# Patient Record
Sex: Male | Born: 1955 | Race: White | Hispanic: No | Marital: Married | Smoking: Current every day smoker
Health system: Southern US, Community
[De-identification: ages and names within clinical notes are randomized; demographics above are authoritative.]

## PROBLEM LIST (undated history)

## (undated) DIAGNOSIS — I10 Essential (primary) hypertension: Secondary | ICD-10-CM

## (undated) DIAGNOSIS — I82409 Acute embolism and thrombosis of unspecified deep veins of unspecified lower extremity: Secondary | ICD-10-CM

## (undated) DIAGNOSIS — G44009 Cluster headache syndrome, unspecified, not intractable: Secondary | ICD-10-CM

## (undated) DIAGNOSIS — I2510 Atherosclerotic heart disease of native coronary artery without angina pectoris: Secondary | ICD-10-CM

## (undated) DIAGNOSIS — J449 Chronic obstructive pulmonary disease, unspecified: Secondary | ICD-10-CM

## (undated) DIAGNOSIS — R011 Cardiac murmur, unspecified: Secondary | ICD-10-CM

## (undated) HISTORY — PX: HEMORRHOID SURGERY: SHX153

## (undated) HISTORY — DX: Atherosclerotic heart disease of native coronary artery without angina pectoris: I25.10

## (undated) HISTORY — DX: Cardiac murmur, unspecified: R01.1

## (undated) HISTORY — PX: WISDOM TOOTH EXTRACTION: SHX21

## (undated) HISTORY — DX: Essential (primary) hypertension: I10

## (undated) HISTORY — DX: Acute embolism and thrombosis of unspecified deep veins of unspecified lower extremity: I82.409

## (undated) HISTORY — DX: Chronic obstructive pulmonary disease, unspecified: J44.9

## (undated) HISTORY — DX: Cluster headache syndrome, unspecified, not intractable: G44.009

---

## 2005-02-05 ENCOUNTER — Encounter: Admission: RE | Admit: 2005-02-05 | Discharge: 2005-02-05 | Payer: Self-pay | Admitting: Internal Medicine

## 2008-12-21 ENCOUNTER — Encounter: Payer: Self-pay | Admitting: Gastroenterology

## 2011-07-24 ENCOUNTER — Encounter: Payer: Self-pay | Admitting: Gastroenterology

## 2011-08-28 ENCOUNTER — Encounter: Payer: Self-pay | Admitting: Gastroenterology

## 2013-02-07 ENCOUNTER — Ambulatory Visit (INDEPENDENT_AMBULATORY_CARE_PROVIDER_SITE_OTHER): Payer: BC Managed Care – PPO | Admitting: Physician Assistant

## 2013-02-07 VITALS — BP 100/60 | HR 58 | Temp 98.4°F | Resp 16 | Ht 71.0 in | Wt 206.0 lb

## 2013-02-07 DIAGNOSIS — J329 Chronic sinusitis, unspecified: Secondary | ICD-10-CM

## 2013-02-07 DIAGNOSIS — Z720 Tobacco use: Secondary | ICD-10-CM | POA: Insufficient documentation

## 2013-02-07 MED ORDER — IPRATROPIUM BROMIDE 0.03 % NA SOLN: 2.0000 | Freq: Two times a day (BID) | NASAL | Status: DC

## 2013-02-07 MED ORDER — AMOXICILLIN-POT CLAVULANATE 875-125 MG PO TABS: 1.0000 | ORAL_TABLET | Freq: Two times a day (BID) | ORAL | Status: DC

## 2013-02-07 MED ORDER — GUAIFENESIN ER 1200 MG PO TB12: 1.0000 | ORAL_TABLET | Freq: Two times a day (BID) | ORAL | Status: DC | PRN

## 2013-02-07 NOTE — Progress Notes (Signed)
I have examined this patient along with the student and agree.  Encouraged smoking cessation.  Flu vaccine given elsewhere.

## 2013-02-07 NOTE — Patient Instructions (Signed)
Drink plenty of fluids and get lots of rest! Take full course of antibiotic, Augmentin. Use Mucinex and Atrovent nasal spray to help loosen up mucus and decrease congestion.

## 2013-02-07 NOTE — Progress Notes (Signed)
  Subjective:    Patient ID: Duffy Dantonio, male    DOB: 1955/11/02, 57 y.o.   MRN: [id number]  HPI  Mr Ferd Horrigan is a 57 year old with a 1 PPD smoking history and cluster headaches who complains today of sinus pain and congestion, nasal congestion, and cough for the past 3 weeks. His symptoms were resolving last week so he went out and did heavy work for two days. Over the weekend, he was extremely fatigued and his sinus pain and congestion has worsened. He does not believe he has had fever, chills, or night sweats. His cough is productive at times, yellow-green sputum with no streaks of blood. He has nasal drainage down his throat and sinus congestion. He developed left upper tooth pain this weekend that is sensitive to deep breaths. No SOB, chest pain/tightness, headaches, or ear congestion/pain. No nausea,vomiting, diarrhea, or constipation.   He has tried various OTC medicines for his symptoms but his cold has lingered. He has had sinus infections in the past and they tend to linger. He had a CT of his sinuses a few years ago which were 'clear.'    Review of Systems As above    Objective:   Physical Exam  Constitutional: He is oriented to person, place, and time. He appears well-developed and well-nourished.  HENT:  Head: Normocephalic and atraumatic.  Right Ear: Tympanic membrane, external ear and ear canal normal.  Left Ear: Tympanic membrane, external ear and ear canal normal.  Nose: Rhinorrhea present. No mucosal edema or sinus tenderness. Right sinus exhibits no maxillary sinus tenderness and no frontal sinus tenderness. Left sinus exhibits maxillary sinus tenderness. Left sinus exhibits no frontal sinus tenderness.  Mouth/Throat: Uvula is midline. Abnormal dentition. Dental caries present. Posterior oropharyngeal erythema present. No oropharyngeal exudate, posterior oropharyngeal edema or tonsillar abscesses.  Eyes: Conjunctivae are normal. Right eye exhibits no discharge. Left eye  exhibits no discharge.  Cardiovascular: Normal rate, regular rhythm and normal heart sounds.   Pulmonary/Chest: Effort normal and breath sounds normal.  Lymphadenopathy:    He has no cervical adenopathy.  Neurological: He is alert and oriented to person, place, and time.  Skin: He is not diaphoretic.  Psychiatric: He has a normal mood and affect.   BP 100/60  Pulse 58  Temp(Src) 98.4 F (36.9 C) (Oral)  Resp 16  Ht 5\' 11"  (1.803 m)  Wt 206 lb (93.441 kg)  BMI 28.74 kg/m2  SpO2 98%     Assessment & Plan:   Sinusitis - Plan: amoxicillin-clavulanate (AUGMENTIN) 875-125 MG per tablet, Guaifenesin (MUCINEX MAXIMUM STRENGTH) 1200 MG TB12, ipratropium (ATROVENT) 0.03 % nasal spray  Patient Instructions  Drink plenty of fluids and get lots of rest! Take full course of antibiotic, Augmentin. Use Mucinex and Atrovent nasal spray to help loosen up mucus and decrease congestion.

## 2014-11-03 ENCOUNTER — Ambulatory Visit
Admission: RE | Admit: 2014-11-03 | Discharge: 2014-11-03 | Disposition: A | Discharge status: Home / Self Care | Payer: BLUE CROSS/BLUE SHIELD | Source: Ambulatory Visit | Attending: Internal Medicine | Admitting: Internal Medicine

## 2014-11-03 ENCOUNTER — Other Ambulatory Visit: Payer: Self-pay | Admitting: Internal Medicine

## 2014-11-03 DIAGNOSIS — R52 Pain, unspecified: Secondary | ICD-10-CM

## 2014-12-26 ENCOUNTER — Ambulatory Visit (INDEPENDENT_AMBULATORY_CARE_PROVIDER_SITE_OTHER): Payer: BLUE CROSS/BLUE SHIELD | Admitting: Sports Medicine

## 2014-12-26 ENCOUNTER — Encounter: Payer: Self-pay | Admitting: Sports Medicine

## 2014-12-26 VITALS — BP 125/86 | Ht 72.0 in | Wt 200.0 lb

## 2014-12-26 DIAGNOSIS — M7742 Metatarsalgia, left foot: Secondary | ICD-10-CM | POA: Diagnosis not present

## 2014-12-26 DIAGNOSIS — M7741 Metatarsalgia, right foot: Secondary | ICD-10-CM | POA: Diagnosis not present

## 2014-12-26 NOTE — Progress Notes (Signed)
   Subjective:    Patient ID: Brad Massey, male    DOB: [birth year], 59 y.o.   MRN: [id number]  HPI chief complaint: Left foot pain  Very pleasant 59 year old male comes in today at the request of Dr. Alfonso Ramus for evaluation and consideration of custom orthotics. He was seen by Dr. Alfonso Ramus on August 23 and diagnosed with metatarsalgia. Patient was given a prescription for [employer] to get custom orthotics but the patient preferred to come here. That is his primary reason for today's visit. He localizes pain along the plantar aspect of both feet, left greater than right primarily at the third metatarsal heads. He denies trauma. No swelling. No numbness or tingling.  Medical history reviewed Medications reviewed Allergies reviewed    Review of Systems     Objective:   Physical Exam Well-developed, well-nourished. No acute distress. Awake alert and oriented 3. Vital signs reviewed  Examination of his feet in the standing position shows a well-preserved arch.-She has a little bit of pes cavus on the left. No soft tissue swelling. He is tender to palpation along the third metatarsal heads bilaterally, left greater than right. He also has some callus buildup along the third fourth and fifth metatarsal heads. No erythema. Neurovascularly intact distally. Walking without a limp.       Assessment & Plan:  Bilateral foot pain, left greater than right secondary to metatarsalgia  Custom orthotics were created today. Metatarsal pads were added. Patient found these comfortable prior to leaving the office. A total of 30 minutes was spent with the patient with greater than 50% of the time spent in face-to-face consultation discussing orthotic construction, instruction, and fitting. Patient found the orthotics comfortable prior to leaving. Patient will follow-up as needed.  Patient was fitted for a : standard, cushioned, semi-rigid orthotic. The orthotic was heated and afterward the patient stood on the  orthotic blank positioned on the orthotic stand. The patient was positioned in subtalar neutral position and 10 degrees of ankle dorsiflexion in a weight bearing stance. After completion of molding, a stable base was applied to the orthotic blank. The blank was ground to a stable position for weight bearing. Size: 11 Base: Blue EVA Posting: none Additional orthotic padding: b/l metatarsal pads

## 2015-03-15 ENCOUNTER — Other Ambulatory Visit: Payer: Self-pay | Admitting: Internal Medicine

## 2015-03-15 DIAGNOSIS — G4489 Other headache syndrome: Secondary | ICD-10-CM

## 2015-03-21 ENCOUNTER — Ambulatory Visit
Admission: RE | Admit: 2015-03-21 | Discharge: 2015-03-21 | Disposition: A | Discharge status: Home / Self Care | Payer: BLUE CROSS/BLUE SHIELD | Source: Ambulatory Visit | Attending: Internal Medicine | Admitting: Internal Medicine

## 2015-03-21 DIAGNOSIS — G4489 Other headache syndrome: Secondary | ICD-10-CM

## 2015-04-10 ENCOUNTER — Ambulatory Visit
Admission: RE | Admit: 2015-04-10 | Discharge: 2015-04-10 | Disposition: A | Discharge status: Home / Self Care | Payer: BLUE CROSS/BLUE SHIELD | Source: Ambulatory Visit | Attending: Internal Medicine | Admitting: Internal Medicine

## 2015-04-10 ENCOUNTER — Other Ambulatory Visit: Payer: Self-pay | Admitting: Internal Medicine

## 2015-04-10 DIAGNOSIS — M7989 Other specified soft tissue disorders: Secondary | ICD-10-CM

## 2015-04-10 DIAGNOSIS — M79604 Pain in right leg: Secondary | ICD-10-CM

## 2016-07-21 IMAGING — US US EXTREM LOW VENOUS*R*
1 series · 13 of 24 positions shown · non-contrast
Comparison: None.

CLINICAL DATA: Right calf pain and edema since [REDACTED]. History
smoking. Evaluate for DVT.



[Series 1: us extrem low venous*right* · 13 of 48 slices shown]
[im 1/48]
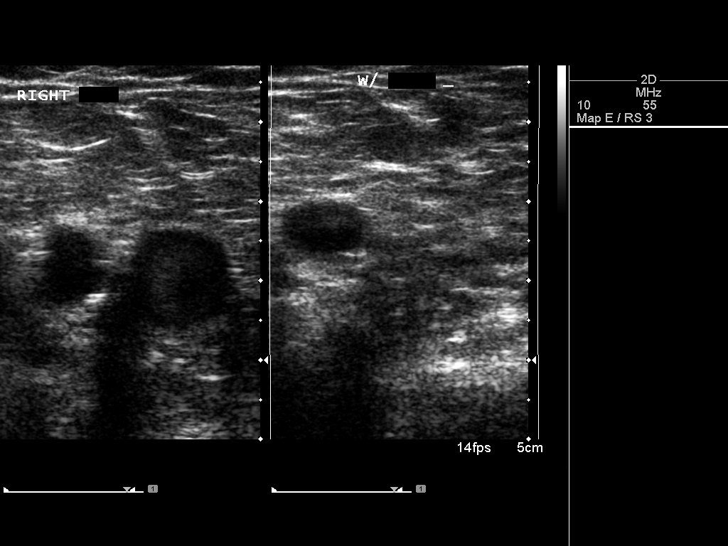
[im 5/48]
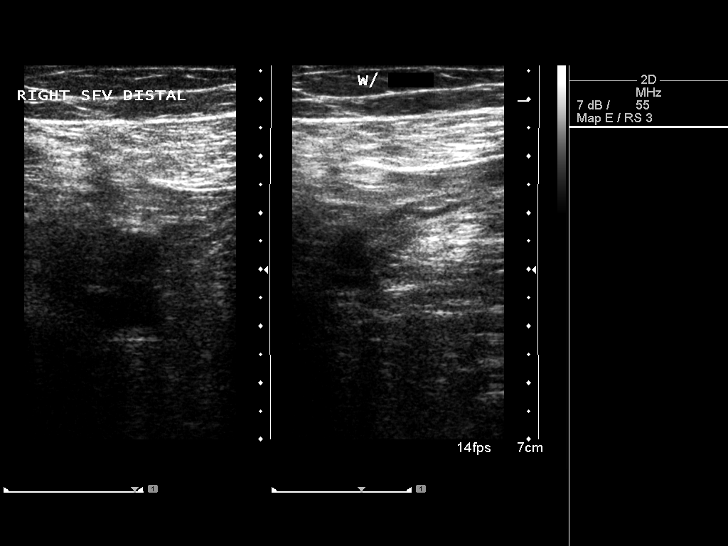
[im 9/48]
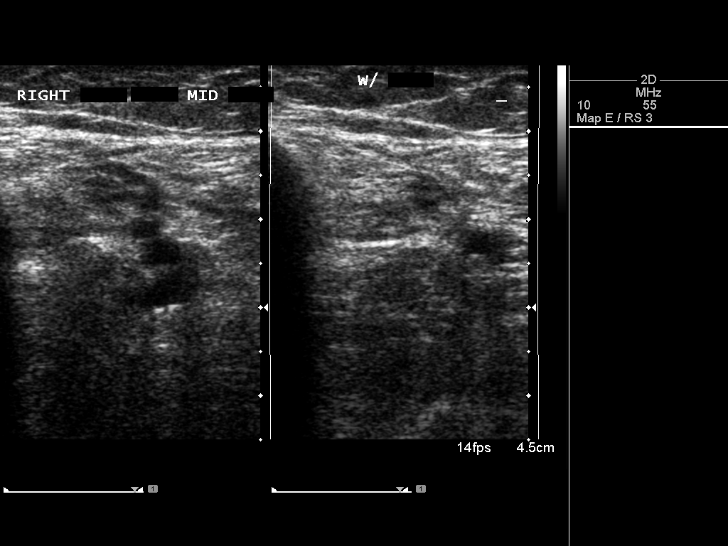
[im 13/48]
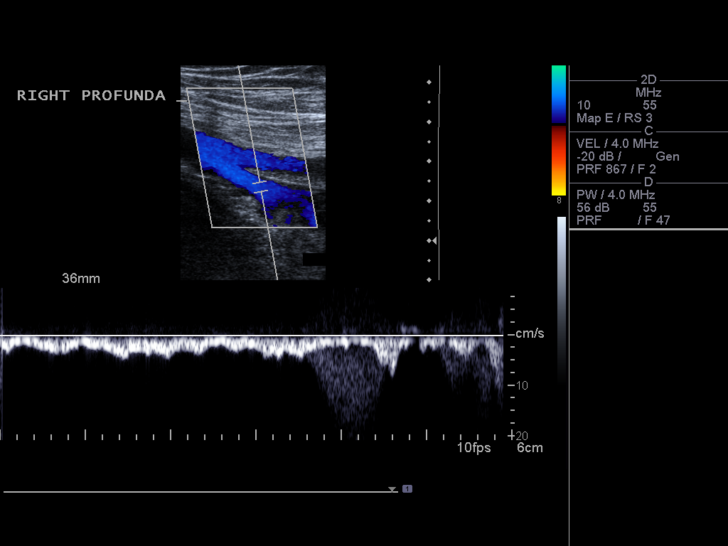
[im 17/48]
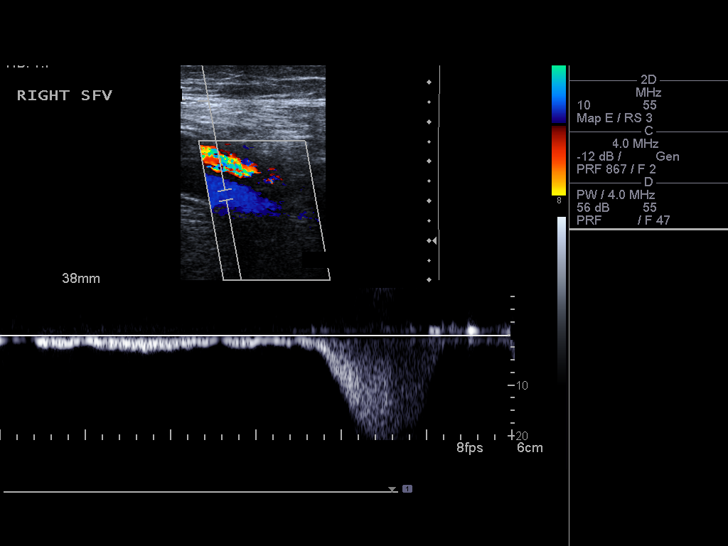
[im 21/48]
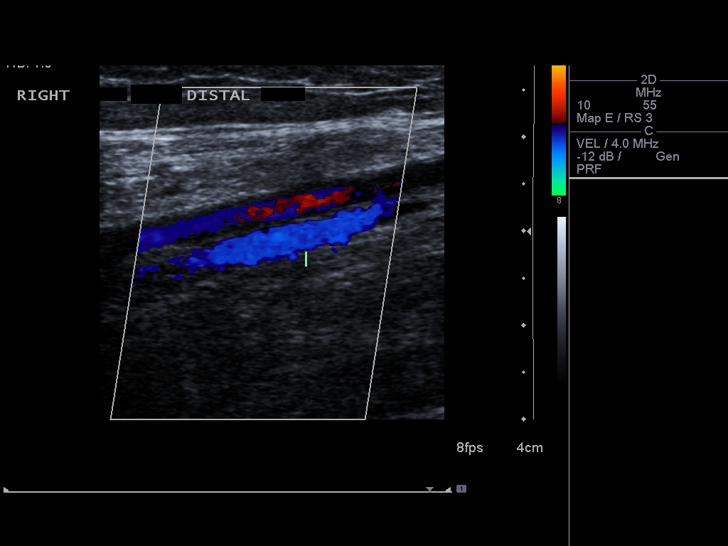
[im 25/48]
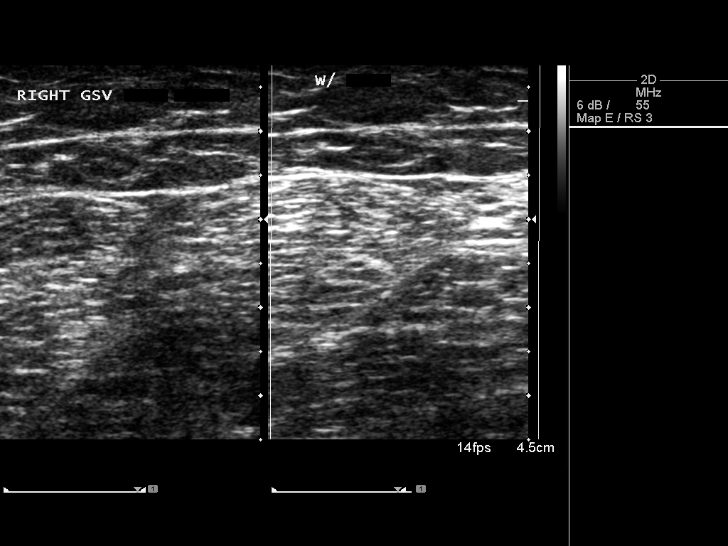
[im 27/48]
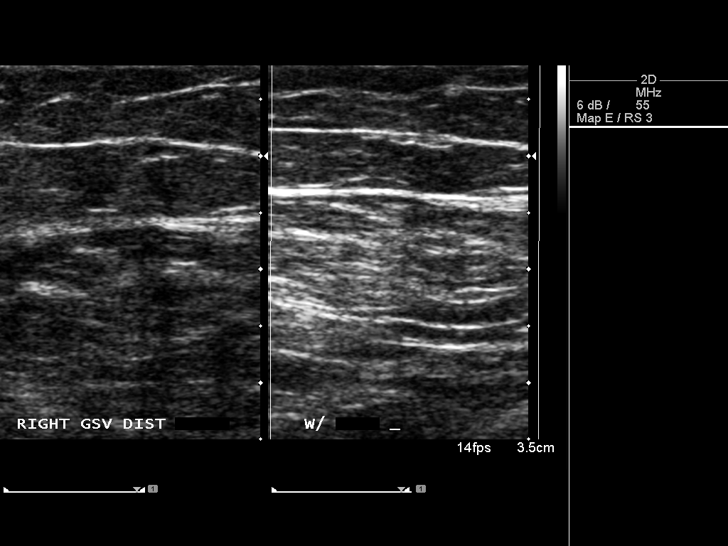
[im 31/48]
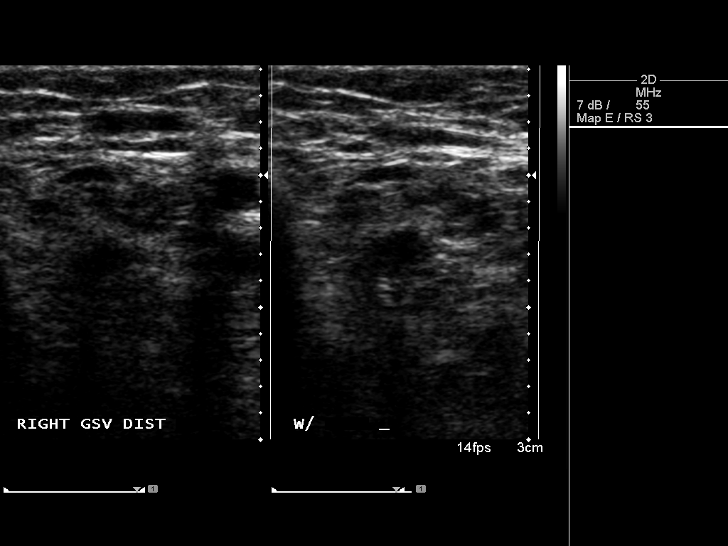
[im 35/48]
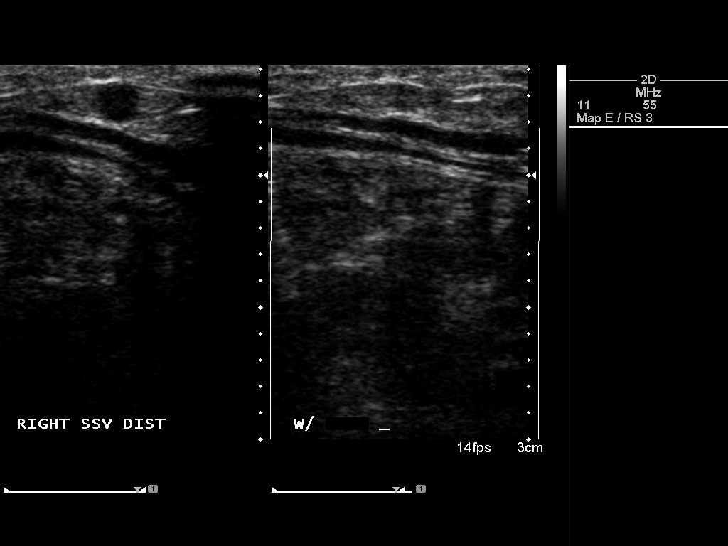
[im 39/48]
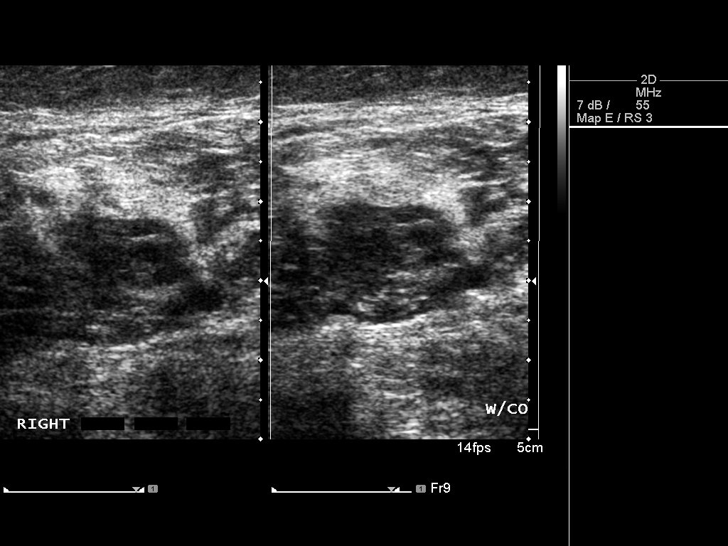
[im 43/48]
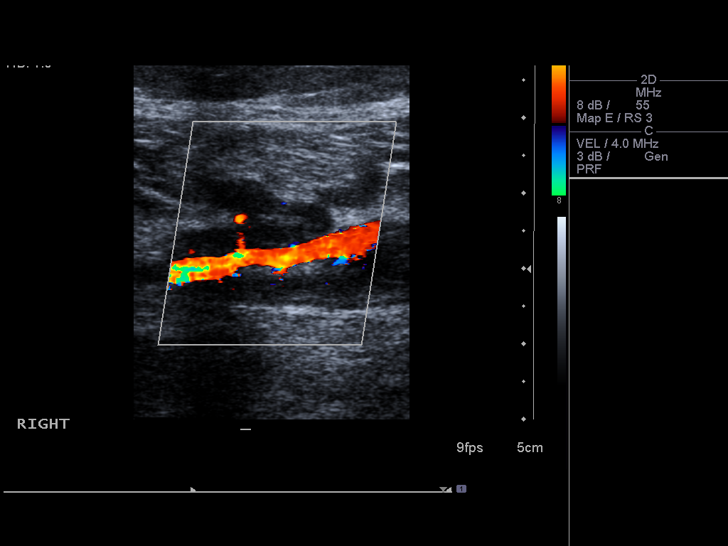
[im 48/48]
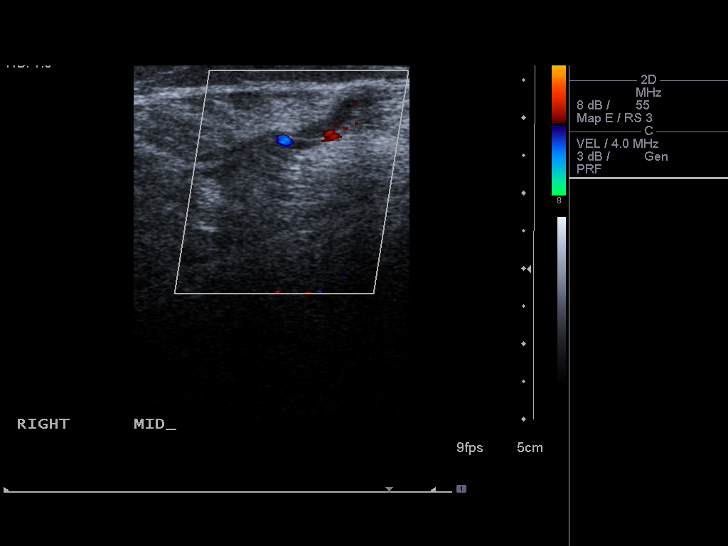

[13 of 24 positions shown; findings below may reference images not displayed]

FINDINGS: Contralateral Common Femoral Vein: Respiratory phasicity is normal
and symmetric with the symptomatic side. No evidence of thrombus.
Normal compressibility.

Common Femoral Vein: No evidence of thrombus. Normal
compressibility, respiratory phasicity and response to augmentation.

Saphenofemoral Junction: No evidence of thrombus. Normal
compressibility and flow on color Doppler imaging.

Profunda Femoral Vein: No evidence of thrombus. Normal
compressibility and flow on color Doppler imaging.

Femoral Vein: No evidence of thrombus. Normal compressibility,
respiratory phasicity and response to augmentation.

Popliteal Vein: No evidence of thrombus. Normal compressibility,
respiratory phasicity and response to augmentation.

Calf Veins: No evidence of thrombus. Normal compressibility and flow
on color Doppler imaging.

Superficial Great Saphenous Vein: No evidence of thrombus. Normal
compressibility and flow on color Doppler imaging.

Venous Reflux:  None.

Other Findings: There is hypoechoic expansile occlusive superficial
thrombophlebitis involving the lesser saphenous vein extending to
involve paired posterior tibial perforator veins within the upper
and mid calf (representative images 38 through 47). This extends to
abut the confluence with the right popliteal vein (image 38) without
definitive extension to deep venous system of the right lower
extremity.
IMPRESSION: 1. No evidence of DVT within right lower extremity.
2. Examination is positive for is superficial thrombophlebitis
involving the lesser saphenous vein extending to involve the paired
posterior tibial perforator veins within the upper and mid calf.

## 2017-06-15 ENCOUNTER — Other Ambulatory Visit: Payer: Self-pay | Admitting: Internal Medicine

## 2017-06-15 ENCOUNTER — Ambulatory Visit
Admission: RE | Admit: 2017-06-15 | Discharge: 2017-06-15 | Disposition: A | Discharge status: Home / Self Care | Payer: Managed Care, Other (non HMO) | Source: Ambulatory Visit | Attending: Internal Medicine | Admitting: Internal Medicine

## 2017-06-15 DIAGNOSIS — R1031 Right lower quadrant pain: Secondary | ICD-10-CM

## 2018-01-29 ENCOUNTER — Other Ambulatory Visit: Payer: Self-pay | Admitting: Internal Medicine

## 2018-02-02 ENCOUNTER — Other Ambulatory Visit: Payer: Self-pay | Admitting: Internal Medicine

## 2018-02-02 DIAGNOSIS — Z122 Encounter for screening for malignant neoplasm of respiratory organs: Secondary | ICD-10-CM

## 2018-06-25 ENCOUNTER — Other Ambulatory Visit: Payer: Self-pay | Admitting: Podiatry

## 2018-06-25 ENCOUNTER — Ambulatory Visit: Payer: Managed Care, Other (non HMO) | Admitting: Podiatry

## 2018-06-25 ENCOUNTER — Ambulatory Visit (INDEPENDENT_AMBULATORY_CARE_PROVIDER_SITE_OTHER): Payer: Managed Care, Other (non HMO)

## 2018-06-25 DIAGNOSIS — M7741 Metatarsalgia, right foot: Secondary | ICD-10-CM

## 2018-06-25 DIAGNOSIS — D3610 Benign neoplasm of peripheral nerves and autonomic nervous system, unspecified: Secondary | ICD-10-CM

## 2018-06-25 DIAGNOSIS — M7752 Other enthesopathy of left foot: Secondary | ICD-10-CM | POA: Diagnosis not present

## 2018-06-25 DIAGNOSIS — M7751 Other enthesopathy of right foot: Secondary | ICD-10-CM

## 2018-06-25 DIAGNOSIS — M7742 Metatarsalgia, left foot: Secondary | ICD-10-CM | POA: Diagnosis not present

## 2018-06-25 DIAGNOSIS — M779 Enthesopathy, unspecified: Secondary | ICD-10-CM

## 2018-06-25 MED ORDER — MELOXICAM 15 MG PO TABS: 15.0000 mg | ORAL_TABLET | Freq: Every day | ORAL | 0 refills | Status: AC

## 2018-06-25 MED ORDER — TRIAMCINOLONE ACETONIDE 10 MG/ML IJ SUSP: 10.0000 mg | Freq: Once | INTRAMUSCULAR | Status: DC

## 2018-06-26 NOTE — Progress Notes (Signed)
Subjective:   Patient ID: Brad Massey, male   DOB: 63 y.o.   MRN: [id number]   HPI 63 year old male presents the office today for concerns of pain to both of his feet.  He states that he previously did see orthopedics and he was told he had metatarsalgia and he was given orthotics and this was done through [employer].  He states this has not been helpful.  He had a similar issue about 4 to 5 years ago and he went to sports medicine had inserts made and after several months the pain resided.  He does describe pain in the balls of his feet as well as some burning sensations into the toes that he describes the middle 3 toes.  The right side is worse than left.  He denies any recent injury or trauma denies any swelling.  He has no other concerns.   Review of Systems  All other systems reviewed and are negative.  Past Medical History:  Diagnosis Date  . Cluster headache     No past surgical history on file.   Current Outpatient Medications:  .  amoxicillin-clavulanate (AUGMENTIN) 875-125 MG per tablet, Take 1 tablet by mouth 2 (two) times daily., Disp: 20 tablet, Rfl: 0 .  ciprofloxacin (CIPRO) 500 MG tablet, Take 500 mg by mouth 2 (two) times daily., Disp: , Rfl: 0 .  FLUoxetine (PROZAC) 20 MG tablet, Take 20 mg by mouth daily., Disp: , Rfl:  .  Guaifenesin (MUCINEX MAXIMUM STRENGTH) 1200 MG TB12, Take 1 tablet (1,200 mg total) by mouth every 12 (twelve) hours as needed., Disp: 14 tablet, Rfl: 1 .  ipratropium (ATROVENT) 0.03 % nasal spray, Place 2 sprays into the nose 2 (two) times daily., Disp: 30 mL, Rfl: 0 .  meloxicam (MOBIC) 15 MG tablet, Take 1 tablet (15 mg total) by mouth daily., Disp: 30 tablet, Rfl: 0 .  metoprolol (LOPRESSOR) 50 MG tablet, Take 50 mg by mouth 2 (two) times daily., Disp: , Rfl:  .  SUMAtriptan (IMITREX) 100 MG tablet, TAKE 1 TABLET AT ONSET OF HEADACHE, Disp: , Rfl: 99 .  verapamil (CALAN-SR) 240 MG CR tablet, Take 240 mg by mouth 2 (two) times daily., Disp: , Rfl: 4 .   verapamil (COVERA HS) 240 MG (CO) 24 hr tablet, Take 240 mg by mouth 2 (two) times daily., Disp: , Rfl:   Current Facility-Administered Medications:  .  triamcinolone acetonide (KENALOG) 10 MG/ML injection 10 mg, 10 mg, Other, Once, Brad Massey, DPM  No Known Allergies       Objective:  Physical Exam  General: AAO x3, NAD  Dermatological: Skin is warm, dry and supple bilateral. Nails x 10 are well manicured; remaining integument appears unremarkable at this time. There are no open sores, no preulcerative lesions, no rash or signs of infection present.  Vascular: Dorsalis Pedis artery and Posterior Tibial artery pedal pulses are 2/4 bilateral with immedate capillary fill time. Pedal hair growth present. No varicosities and no lower extremity edema present bilateral. There is no pain with calf compression, swelling, warmth, erythema.   Neruologic: Grossly intact via light touch bilateral. Vibratory intact via tuning fork bilateral. Protective threshold with Semmes Wienstein monofilament intact to all pedal sites bilateral.   Musculoskeletal: Mild cavus foot deformities present.  There is prominence of metatarsal heads.,  Patient has no pain to recommend metatarsal at this point on the interspaces.  Most discomfort is along the second interspace of the right foot and there is some minimal edema  to this area.  He has similar symptoms in the left foot but not as symptomatic.  There is no area pinpoint bony tenderness or pain to vibratory sensation.  Muscular strength 5/5 in all groups tested bilateral.  Gait: Unassisted, Nonantalgic.       Assessment:   63 year old male with possible neuroma; metatarsalgia    Plan:  -Treatment options discussed including all alternatives, risks, and complications -Etiology of symptoms were discussed -X-rays were obtained and reviewed with the patient.  No evidence of acute fracture or stress fracture.  Bunion deformities present. -Steroid  injection performed the right second interspace today.  See procedure note below. -Prescribed mobic. Discussed side effects of the medication and directed to stop if any are to occur and call the office.  -I added a neuroma pad to the inserts that came inside of his shoes.  The custom orthotics were not helpful he was to start over. I will have him follow-up with Brad Massey or Brad Massey for this.   Procedure: Injection Neuroma Discussed alternatives, risks, complications and verbal consent was obtained.  Location: Right 2nd interspace Skin Prep: Alcohol Injectate: 0.5cc 0.5% marcaine plain, 0.5 cc 2% lidocaine plain and, 1 cc kenalog 10. Disposition: Patient tolerated procedure well. Injection site dressed with a band-aid.  Post-injection care was discussed and return precautions discussed.    Brad Massey DPM

## 2018-07-15 ENCOUNTER — Other Ambulatory Visit: Payer: Managed Care, Other (non HMO) | Admitting: Orthotics

## 2018-10-26 ENCOUNTER — Ambulatory Visit (INDEPENDENT_AMBULATORY_CARE_PROVIDER_SITE_OTHER): Payer: Managed Care, Other (non HMO) | Admitting: Orthotics

## 2018-10-26 ENCOUNTER — Other Ambulatory Visit: Payer: Self-pay

## 2018-10-26 DIAGNOSIS — M6788 Other specified disorders of synovium and tendon, other site: Secondary | ICD-10-CM

## 2018-10-26 DIAGNOSIS — D3610 Benign neoplasm of peripheral nerves and autonomic nervous system, unspecified: Secondary | ICD-10-CM

## 2018-10-26 DIAGNOSIS — M7741 Metatarsalgia, right foot: Secondary | ICD-10-CM

## 2018-10-26 DIAGNOSIS — M7742 Metatarsalgia, left foot: Secondary | ICD-10-CM | POA: Diagnosis not present

## 2018-10-26 NOTE — Progress Notes (Signed)
Patient came into today to be cast for Custom Foot Orthotics. Upon recommendation of Dr. Jacqualyn Posey, Patient presents with metatarsalgia, mild pes cavus deformity, fat pad atropy Goals are arch uspport oand FF cushioning Plan vendor [employer]

## 2018-11-09 ENCOUNTER — Other Ambulatory Visit: Payer: Self-pay

## 2018-11-09 ENCOUNTER — Ambulatory Visit: Payer: Managed Care, Other (non HMO) | Admitting: Orthotics

## 2018-11-09 DIAGNOSIS — M6788 Other specified disorders of synovium and tendon, other site: Secondary | ICD-10-CM

## 2018-11-09 NOTE — Progress Notes (Signed)
Patient came in today to pick up custom made foot orthotics.  The goals were accomplished and the patient reported no dissatisfaction with said orthotics.  Patient was advised of breakin period and how to report any issues. 

## 2019-12-09 ENCOUNTER — Ambulatory Visit: Payer: Self-pay | Admitting: Cardiology

## 2019-12-20 ENCOUNTER — Ambulatory Visit: Payer: Self-pay | Admitting: Cardiology

## 2020-01-18 NOTE — Progress Notes (Signed)
Cardiology Office Note:   Date:  01/19/2020  NAME:  Brad Massey    MRN: [id number] DOB:  [birth year]   PCP:  Haywood Pao, MD  Cardiologist:  No primary care provider on file.   Referring MD: Haywood Pao, MD   Chief Complaint  Patient presents with  . Shortness of Breath   History of Present Illness:   Brad Massey is a 64 y.o. male with a hx of headache who is being seen today for the evaluation of shortness of breath at the request of Tisovec, Fransico Him, MD.  He reports for the last 6 months has had intermittent episodes of shortness of breath.  He reports symptoms recurred infrequently.  He reports possibly every 3 weeks.  He reports when he exerts himself heavily he does get a little winded.  He reports symptoms have improved lately.  Despite this he maintains a high level of activity walking up to 8-10,000 steps daily.  He denies any chest pain or pressure with this level of activity.  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  He does a lot of handyman activity and is able to do this without limitations.  His blood pressure is a bit elevated today 150/100.  It was also elevated when he saw his PCP.  He is out of medication.  We did discuss watching this for now and we will give him information about salt intake.  He is an everyday smoker.  He smoked about 1 pack/day for 40 years.  He is never been diagnosed with COPD.  He is working on quitting.  He also suffers from cluster headaches and takes verapamil infrequently.  EKG today demonstrates normal sinus rhythm with nonspecific ST-T changes.  He reports a family history of heart disease in his father who had several angioplasties.  His mother has atrial fibrillation.  His mother is in her early 13s and doing well.  She still is at home.  He has 2 daughters.  He is married.  Most recent LDL cholesterol was 118.  He is not diabetic.  He apparently had a chest x-ray at his primary care physician's office.  There was  something alarming that mandated referral to [place].  We are unable to see the results that chest x-ray.  Total cholesterol 175, triglycerides 81, HDL 41, LDL 118  Problem List 1. Tobacco abuse  2. Cluster headache  Past Medical History: Past Medical History:  Diagnosis Date  . Cluster headache   . DVT (deep venous thrombosis) ([place])   . Heart murmur   . Hypertension     Past Surgical History: Past Surgical History:  Procedure Laterality Date  . WISDOM TOOTH EXTRACTION      Current Medications: Current Meds  Medication Sig  . verapamil (COVERA HS) 240 MG (CO) 24 hr tablet Take 240 mg by mouth 2 (two) times daily.   Current Facility-Administered Medications for the 01/19/20 encounter (Office Visit) with Geralynn Rile, MD  Medication  . triamcinolone acetonide (KENALOG) 10 MG/ML injection 10 mg     Allergies:    Patient has no known allergies.   Social History: Social History   Socioeconomic History  . Marital status: Married    Spouse name: Not on file  . Number of children: 2  . Years of education: Not on file  . Highest education level: Not on file  Occupational History  . Not on file  Tobacco Use  . Smoking status: Current Every Day Smoker  Packs/day: 1.00    Years: 40.00    Pack years: 40.00  . Smokeless tobacco: Never Used  Substance and Sexual Activity  . Alcohol use: Not Currently  . Drug use: Never  . Sexual activity: Yes    Partners: Female  Other Topics Concern  . Not on file  Social History Narrative  . Not on file   Social Determinants of Health   Financial Resource Strain:   . Difficulty of Paying Living Expenses: Not on file  Food Insecurity:   . Worried About Charity fundraiser in the Last Year: Not on file  . Ran Out of Food in the Last Year: Not on file  Transportation Needs:   . Lack of Transportation (Medical): Not on file  . Lack of Transportation (Non-Medical): Not on file  Physical Activity:   . Days of Exercise per  Week: Not on file  . Minutes of Exercise per Session: Not on file  Stress:   . Feeling of Stress : Not on file  Social Connections:   . Frequency of Communication with Friends and Family: Not on file  . Frequency of Social Gatherings with Friends and Family: Not on file  . Attends Religious Services: Not on file  . Active Member of Clubs or Organizations: Not on file  . Attends Archivist Meetings: Not on file  . Marital Status: Not on file     Family History: The patient's family history includes Arrhythmia in his mother; Heart disease in his father and mother.  ROS:   All other ROS reviewed and negative. Pertinent positives noted in the HPI.     EKGs/Labs/Other Studies Reviewed:   The following studies were personally reviewed by me today:  EKG:  EKG is ordered today.  The ekg ordered today demonstrates normal sinus rhythm, heart rate 91, no acute ST-T changes, no evidence of prior infarction, and was personally reviewed by me.   Recent Labs: No results found for requested labs within last 8760 hours.   Recent Lipid Panel No results found for: CHOL, TRIG, HDL, CHOLHDL, VLDL, LDLCALC, LDLDIRECT  Physical Exam:   VS:  BP (!) 150/100   Pulse 91   Temp (!) 97.4 F (36.3 C)   Ht 6' (1.829 m)   Wt 197 lb 12.8 oz (89.7 kg)   SpO2 96%   BMI 26.83 kg/m    Wt Readings from Last 3 Encounters:  01/19/20 197 lb 12.8 oz (89.7 kg)  12/26/14 200 lb (90.7 kg)  02/07/13 206 lb (93.4 kg)    General: Well nourished, well developed, in no acute distress Heart: Atraumatic, normal size  Eyes: PEERLA, EOMI  Neck: Supple, no JVD Endocrine: No thryomegaly Cardiac: Normal S1, S2; RRR; no murmurs, rubs, or gallops Lungs: Clear to auscultation bilaterally, no wheezing, rhonchi or rales  Abd: Soft, nontender, no hepatomegaly  Ext: No edema, pulses 2+ Musculoskeletal: No deformities, BUE and BLE strength normal and equal Skin: Warm and dry, no rashes   Neuro: Alert and oriented  to person, place, time, and situation, CNII-XII grossly intact, no focal deficits  Psych: Normal mood and affect   ASSESSMENT:   Brad Massey is a 64 y.o. male who presents for the following: 1. SOB (shortness of breath) on exertion   2. Essential hypertension   3. Tobacco abuse   4. Abnormal electrocardiogram     PLAN:   1. SOB (shortness of breath) on exertion -Intermittent exertional shortness of breath.  Appears to be improving.  EKG with nonspecific ST-T changes.  Major CVD risk factors include smoking and likely undiagnosed hypertension.  He reports no chest pain or pressure.  Symptoms seem to have improved lately.  I recommended a coronary calcium score.  This will help [place] know if he needs to be on a statin agent.  This could also lead to a stress test.  Given his lack of major symptoms currently I see no need for stress testing at this time.  His cardiovascular exam is without murmurs rubs or gallops.  We will start with a calcium score.  2. Essential hypertension -Blood pressure a bit elevated today.  I have given him the option to try diet and exercise.  We will give him information about reducing salt intake.  I will then see him back in 3 months to discuss further.  He will also get a blood pressure cuff.  3. Tobacco abuse -40 pack years.  Likely contributing to his shortness of breath.  Advised to quit smoking.  Smoking cessation counseling provided.  Disposition: Return in about 3 months (around 04/19/2020).  Medication Adjustments/Labs and Tests Ordered: Current medicines are reviewed at length with the patient today.  Concerns regarding medicines are outlined above.  No orders of the defined types were placed in this encounter.  No orders of the defined types were placed in this encounter.   Patient Instructions  Medication Instructions:  No Changes In Medications at this time.  *If you need a refill on your cardiac medications before your next appointment, please call  your pharmacy*  Lab Work: None Ordered At This Time.  If you have labs (blood work) drawn today and your tests are completely normal, you will receive your results only by: Marland Kitchen MyChart Message (if you have MyChart) OR . A paper copy in the mail If you have any lab test that is abnormal or we need to change your treatment, we will call you to review the results.  Testing/Procedures:  Dr. Audie Box has ordered a CT coronary calcium score. This test is done at [address]. This is $150 out of pocket.  Coronary CalciumScan A coronary calcium scan is an imaging test used to look for deposits of calcium and other fatty materials (plaques) in the inner lining of the blood vessels of the heart (coronary arteries). These deposits of calcium and plaques can partly clog and narrow the coronary arteries without producing any symptoms or warning signs. This puts a person at risk for a heart attack. This test can detect these deposits before symptoms develop. Tell a health care provider about:  Any allergies you have.  All medicines you are taking, including vitamins, herbs, eye drops, creams, and over-the-counter medicines.  Any problems you or family members have had with anesthetic medicines.  Any blood disorders you have.  Any surgeries you have had.  Any medical conditions you have.  Whether you are pregnant or may be pregnant. What are the risks? Generally, this is a safe procedure. However, problems may occur, including:  Harm to a pregnant woman and her unborn baby. This test involves the use of radiation. Radiation exposure can be dangerous to a pregnant woman and her unborn baby. If you are pregnant, you generally should not have this procedure done.  Slight increase in the risk of cancer. This is because of the radiation involved in the test. What happens before the procedure? No preparation is needed for this procedure. What happens during the procedure?  You  will  undress and remove any jewelry around your neck or chest.  You will put on a hospital gown.  Sticky electrodes will be placed on your chest. The electrodes will be connected to an electrocardiogram (ECG) machine to record a tracing of the electrical activity of your heart.  A CT scanner will take pictures of your heart. During this time, you will be asked to lie still and hold your breath for 2-3 seconds while a picture of your heart is being taken. The procedure may vary among health care providers and hospitals. What happens after the procedure?  You can get dressed.  You can return to your normal activities.  It is up to you to get the results of your test. Ask your health care provider, or the department that is doing the test, when your results will be ready. Summary  A coronary calcium scan is an imaging test used to look for deposits of calcium and other fatty materials (plaques) in the inner lining of the blood vessels of the heart (coronary arteries).  Generally, this is a safe procedure. Tell your health care provider if you are pregnant or may be pregnant.  No preparation is needed for this procedure.  A CT scanner will take pictures of your heart.  You can return to your normal activities after the scan is done. This information is not intended to replace advice given to you by your health care provider. Make sure you discuss any questions you have with your health care provider. Document Released: 10/11/2007 Document Revised: 03/03/2016 Document Reviewed: 03/03/2016 Elsevier Interactive Patient Education  2017 [place]: At [facility], you and your health needs are our priority.  As part of our continuing mission to provide you with exceptional heart care, we have created designated Provider Care Teams.  These Care Teams include your primary Cardiologist (physician) and Advanced Practice Providers (APPs -  Physician Assistants and Nurse Practitioners)  who all work together to provide you with the care you need, when you need it.  We recommend signing up for the patient portal called "MyChart".  Sign up information is provided on this After Visit Summary.  MyChart is used to connect with patients for Virtual Visits (Telemedicine).  Patients are able to view lab/test results, encounter notes, upcoming appointments, etc.  Non-urgent messages can be sent to your provider as well.   To learn more about what you can do with MyChart, go to [email or web address].    Your next appointment:   3 month(s)  The format for your next appointment:   In Person  Provider:   Eleonore Chiquito, MD  Other Instructions       Signed, Addison Naegeli. Audie Box, [place]  [address] [place], [place] [address] [phone]  01/19/2020 9:56 AM

## 2020-01-19 ENCOUNTER — Other Ambulatory Visit: Payer: Self-pay

## 2020-01-19 ENCOUNTER — Ambulatory Visit (INDEPENDENT_AMBULATORY_CARE_PROVIDER_SITE_OTHER): Payer: Managed Care, Other (non HMO) | Admitting: Cardiovascular Disease

## 2020-01-19 ENCOUNTER — Encounter: Payer: Self-pay | Admitting: Cardiovascular Disease

## 2020-01-19 VITALS — BP 150/100 | HR 91 | Temp 97.4°F | Ht 72.0 in | Wt 197.8 lb

## 2020-01-19 DIAGNOSIS — Z720 Tobacco use: Secondary | ICD-10-CM

## 2020-01-19 DIAGNOSIS — I10 Essential (primary) hypertension: Secondary | ICD-10-CM

## 2020-01-19 DIAGNOSIS — R0602 Shortness of breath: Secondary | ICD-10-CM

## 2020-01-19 DIAGNOSIS — R9431 Abnormal electrocardiogram [ECG] [EKG]: Secondary | ICD-10-CM

## 2020-01-19 NOTE — Patient Instructions (Signed)
Medication Instructions:  No Changes In Medications at this time.  *If you need a refill on your cardiac medications before your next appointment, please call your pharmacy*  Lab Work: None Ordered At This Time.  If you have labs (blood work) drawn today and your tests are completely normal, you will receive your results only by:  [place] (if you have MyChart) OR  A paper copy in the mail If you have any lab test that is abnormal or we need to change your treatment, we will call you to review the results.  Testing/Procedures:  Dr. Audie Box has ordered a CT coronary calcium score. This test is done at [address]. This is $150 out of pocket.  Coronary CalciumScan A coronary calcium scan is an imaging test used to look for deposits of calcium and other fatty materials (plaques) in the inner lining of the blood vessels of the heart (coronary arteries). These deposits of calcium and plaques can partly clog and narrow the coronary arteries without producing any symptoms or warning signs. This puts a person at risk for a heart attack. This test can detect these deposits before symptoms develop. Tell a health care provider about:  Any allergies you have.  All medicines you are taking, including vitamins, herbs, eye drops, creams, and over-the-counter medicines.  Any problems you or family members have had with anesthetic medicines.  Any blood disorders you have.  Any surgeries you have had.  Any medical conditions you have.  Whether you are pregnant or may be pregnant. What are the risks? Generally, this is a safe procedure. However, problems may occur, including:  Harm to a pregnant woman and her unborn baby. This test involves the use of radiation. Radiation exposure can be dangerous to a pregnant woman and her unborn baby. If you are pregnant, you generally should not have this procedure done.  Slight increase in the risk of cancer. This is because of the  radiation involved in the test. What happens before the procedure? No preparation is needed for this procedure. What happens during the procedure?  You will undress and remove any jewelry around your neck or chest.  You will put on a hospital gown.  Sticky electrodes will be placed on your chest. The electrodes will be connected to an electrocardiogram (ECG) machine to record a tracing of the electrical activity of your heart.  A CT scanner will take pictures of your heart. During this time, you will be asked to lie still and hold your breath for 2-3 seconds while a picture of your heart is being taken. The procedure may vary among health care providers and hospitals. What happens after the procedure?  You can get dressed.  You can return to your normal activities.  It is up to you to get the results of your test. Ask your health care provider, or the department that is doing the test, when your results will be ready. Summary  A coronary calcium scan is an imaging test used to look for deposits of calcium and other fatty materials (plaques) in the inner lining of the blood vessels of the heart (coronary arteries).  Generally, this is a safe procedure. Tell your health care provider if you are pregnant or may be pregnant.  No preparation is needed for this procedure.  A CT scanner will take pictures of your heart.  You can return to your normal activities after the scan is done. This information is not intended to replace  advice given to you by your health care provider. Make sure you discuss any questions you have with your health care provider. Document Released: 10/11/2007 Document Revised: 03/03/2016 Document Reviewed: 03/03/2016 Elsevier Interactive Patient Education  2017 [place]: At [facility], you and your health needs are our priority.  As part of our continuing mission to provide you with exceptional heart care, we have created designated Provider  Care Teams.  These Care Teams include your primary Cardiologist (physician) and Advanced Practice Providers (APPs -  Physician Assistants and Nurse Practitioners) who all work together to provide you with the care you need, when you need it.  We recommend signing up for the patient portal called "MyChart".  Sign up information is provided on this After Visit Summary.  MyChart is used to connect with patients for Virtual Visits (Telemedicine).  Patients are able to view lab/test results, encounter notes, upcoming appointments, etc.  Non-urgent messages can be sent to your provider as well.   To learn more about what you can do with MyChart, go to [email or web address].    Your next appointment:   3 month(s)  The format for your next appointment:   In Person  Provider:   Eleonore Chiquito, MD  Other Instructions

## 2020-01-31 ENCOUNTER — Ambulatory Visit (INDEPENDENT_AMBULATORY_CARE_PROVIDER_SITE_OTHER)
Admission: RE | Admit: 2020-01-31 | Discharge: 2020-01-31 | Disposition: A | Discharge status: Home / Self Care | Payer: Managed Care, Other (non HMO) | Source: Ambulatory Visit

## 2020-01-31 ENCOUNTER — Other Ambulatory Visit: Payer: Self-pay

## 2020-01-31 ENCOUNTER — Ambulatory Visit
Admission: RE | Admit: 2020-01-31 | Discharge: 2020-01-31 | Disposition: A | Discharge status: Home / Self Care | Payer: Self-pay | Source: Ambulatory Visit | Attending: Cardiology | Admitting: Cardiology

## 2020-01-31 ENCOUNTER — Telehealth: Payer: Self-pay | Admitting: Cardiovascular Disease

## 2020-01-31 DIAGNOSIS — I10 Essential (primary) hypertension: Secondary | ICD-10-CM

## 2020-01-31 DIAGNOSIS — Z720 Tobacco use: Secondary | ICD-10-CM

## 2020-01-31 DIAGNOSIS — R0602 Shortness of breath: Secondary | ICD-10-CM

## 2020-01-31 DIAGNOSIS — R9431 Abnormal electrocardiogram [ECG] [EKG]: Secondary | ICD-10-CM

## 2020-01-31 MED ORDER — ROSUVASTATIN CALCIUM 40 MG PO TABS: 40.0000 mg | ORAL_TABLET | Freq: Every day | ORAL | 3 refills | Status: DC

## 2020-01-31 MED ORDER — ASPIRIN EC 81 MG PO TBEC: 81.0000 mg | DELAYED_RELEASE_TABLET | Freq: Every day | ORAL | 3 refills | Status: AC

## 2020-01-31 NOTE — Telephone Encounter (Signed)
Rx sent to pharmacy- testing ordered.

## 2020-01-31 NOTE — Addendum Note (Signed)
Addended by: Rexanne Mano B on: 01/31/2020 10:27 AM   Modules accepted: Orders

## 2020-01-31 NOTE — Telephone Encounter (Signed)
Called Mr. Dupin. High calcium score, 95th percentile. Will start aspirin, crestor 40 mg QD, and get exercise treadmill stress. No chest pain. Still active. Needs to stop smoking.   [place] T. Audie Box, [place]  [address], [place], [place] [address] [phone]  12:53 PM

## 2020-01-31 NOTE — Telephone Encounter (Signed)
Patient needs to be scheduled. Order is in. Please call.  Thank you!

## 2020-02-06 ENCOUNTER — Telehealth: Payer: Self-pay | Admitting: Cardiovascular Disease

## 2020-02-06 ENCOUNTER — Encounter: Payer: Self-pay | Admitting: Cardiovascular Disease

## 2020-02-06 NOTE — Telephone Encounter (Signed)
Spoke with patient regarding ETT and COIVD screening ordered by Dr. Staci Acosta  02/23/20 at 9:45 amd and COVID screening Monday 02/20/20 at 2:55 pm.  Will mail information to patient and he voiced his understanding.

## 2020-02-20 ENCOUNTER — Other Ambulatory Visit (HOSPITAL_COMMUNITY)
Admission: RE | Admit: 2020-02-20 | Discharge: 2020-02-20 | Disposition: A | Discharge status: Home / Self Care | Payer: Managed Care, Other (non HMO) | Source: Ambulatory Visit | Attending: Cardiovascular Disease | Admitting: Cardiovascular Disease

## 2020-02-20 DIAGNOSIS — Z20822 Contact with and (suspected) exposure to covid-19: Secondary | ICD-10-CM | POA: Diagnosis not present

## 2020-02-20 DIAGNOSIS — Z01812 Encounter for preprocedural laboratory examination: Secondary | ICD-10-CM | POA: Diagnosis present

## 2020-02-20 LAB — SARS CORONAVIRUS 2 (TAT 6-24 HRS): SARS Coronavirus 2: NEGATIVE

## 2020-02-21 ENCOUNTER — Telehealth (HOSPITAL_COMMUNITY): Payer: Self-pay | Admitting: *Deleted

## 2020-02-21 NOTE — Telephone Encounter (Signed)
Close encounter 

## 2020-02-23 ENCOUNTER — Ambulatory Visit (HOSPITAL_COMMUNITY)
Admission: RE | Admit: 2020-02-23 | Discharge: 2020-02-23 | Disposition: A | Discharge status: Home / Self Care | Payer: Managed Care, Other (non HMO) | Source: Ambulatory Visit | Attending: Cardiovascular Disease | Admitting: Cardiovascular Disease

## 2020-02-23 ENCOUNTER — Other Ambulatory Visit: Payer: Self-pay

## 2020-02-23 DIAGNOSIS — R0602 Shortness of breath: Secondary | ICD-10-CM | POA: Diagnosis not present

## 2020-02-23 LAB — EXERCISE TOLERANCE TEST
Estimated workload: 7 METS
Exercise duration (min): 6 min
Exercise duration (sec): 0 s
MPHR: 158 {beats}/min
Peak HR: 137 {beats}/min
Percent HR: 86 %
Rest HR: 83 {beats}/min

## 2020-04-03 NOTE — Progress Notes (Signed)
Cardiology Office Note:   Date:  04/06/2020  NAME:  Brad Massey    MRN: [id number] DOB:  March 17, 1956   PCP:  Haywood Pao, MD  Cardiologist:  No primary care provider on file.  Referring MD: Haywood Pao, MD   Chief Complaint  Patient presents with  . Follow-up   History of Present Illness:   Brad Massey is a 64 y.o. male with a hx of CAD, HLD, tobacco abuse who presents for follow-up.  He is still smoking a pack a day.  Has smoked for nearly 30 to 40 years.  Still getting short of breath.  Exercise treadmill stress test was negative despite high coronary calcium scoring.  EKG was normal.  No chest pain or chest tightness reported.  He has lost roughly 5 pound since her last visit.  He has noticed some improvement in his shortness of breath.  On aspirin and a statin.  Needs a repeat lipid profile.  Blood pressure is also elevated today 146/90.  He was taking verapamil due to cluster headaches.  I think it is okay to go back on this.  He reports his blood pressure was well controlled when on verapamil.  He is walking 8000 and 10,000 steps per day.  He is able to walk for several miles without any major limitations.  He does have poor pulses on exam but no claudication symptoms.  No cramping or burning in his legs.  Again, no chest pain or chest pressure when he exerts himself.  He is euvolemic on exam.  Appears to be doing quite well.  Just need to continue with extensive risk factor modifications.  Problem List 1. CAD -coronary calcium score 1082 (95th percentile) -ETT negative 2. HLD -T chol 175, HDL 41, LDL 118, TG 81 3. Tobacco abuse  -40 pack year history  Past Medical History: Past Medical History:  Diagnosis Date  . Cluster headache   . DVT (deep venous thrombosis) ([place])   . Heart murmur   . Hypertension     Past Surgical History: Past Surgical History:  Procedure Laterality Date  . WISDOM TOOTH EXTRACTION      Current Medications: Current Meds  Medication Sig   . aspirin EC 81 MG tablet Take 1 tablet (81 mg total) by mouth daily. Swallow whole.  Marland Kitchen CLINDAMYCIN HCL PO Take 1 tablet by mouth in the morning, at noon, and at bedtime.  . rosuvastatin (CRESTOR) 40 MG tablet Take 1 tablet (40 mg total) by mouth daily.  . [DISCONTINUED] verapamil (COVERA HS) 240 MG (CO) 24 hr tablet Take 240 mg by mouth 2 (two) times daily.   Current Facility-Administered Medications for the 04/06/20 encounter (Office Visit) with Geralynn Rile, MD  Medication  . triamcinolone acetonide (KENALOG) 10 MG/ML injection 10 mg     Allergies:    Patient has no known allergies.   Social History: Social History   Socioeconomic History  . Marital status: Married    Spouse name: Not on file  . Number of children: 2  . Years of education: Not on file  . Highest education level: Not on file  Occupational History  . Not on file  Tobacco Use  . Smoking status: Current Every Day Smoker    Packs/day: 1.00    Years: 40.00    Pack years: 40.00  . Smokeless tobacco: Never Used  Substance and Sexual Activity  . Alcohol use: Not Currently  . Drug use: Never  . Sexual activity: Yes  Partners: Female  Other Topics Concern  . Not on file  Social History Narrative  . Not on file   Social Determinants of Health   Financial Resource Strain: Not on file  Food Insecurity: Not on file  Transportation Needs: Not on file  Physical Activity: Not on file  Stress: Not on file  Social Connections: Not on file     Family History: The patient's family history includes Arrhythmia in his mother; Heart disease in his father and mother.  ROS:   All other ROS reviewed and negative. Pertinent positives noted in the HPI.     EKGs/Labs/Other Studies Reviewed:   The following studies were personally reviewed by me today:  ETT 02/23/2020  There was no ST segment deviation noted during stress.  No T wave inversion was noted during stress.  Blood pressure demonstrated a  hypertensive response to exercise.  Patient exercised 6 minutes on a CVN-Bruce protocol achieving a work level of 7 METs. Max HR achieved was 137bpm which represents 86% of the maximal, age-predicted heart rate.  No ST-T wave changes noted during stress test. Isolated PVCs during rest and recovery.  No electrocardiographic evidence of ischemia.   Recent Labs: No results found for requested labs within last 8760 hours.   Recent Lipid Panel No results found for: CHOL, TRIG, HDL, CHOLHDL, VLDL, LDLCALC, LDLDIRECT  Physical Exam:   VS:  BP (!) 146/90 (BP Location: Left Arm, Patient Position: Sitting, Cuff Size: Normal)   Pulse 84   Ht 6' (1.829 m)   Wt 193 lb (87.5 kg)   BMI 26.18 kg/m    Wt Readings from Last 3 Encounters:  04/06/20 193 lb (87.5 kg)  01/19/20 197 lb 12.8 oz (89.7 kg)  12/26/14 200 lb (90.7 kg)    General: Well nourished, well developed, in no acute distress Head: Atraumatic, normal size  Eyes: PEERLA, EOMI  Neck: Supple, no JVD Endocrine: No thryomegaly Cardiac: Normal S1, S2; RRR; no murmurs, rubs, or gallops Lungs: Clear to auscultation bilaterally, no wheezing, rhonchi or rales  Abd: Soft, nontender, no hepatomegaly  Ext: No edema, diminished pulses in the lower extremities Musculoskeletal: No deformities, BUE and BLE strength normal and equal Skin: Warm and dry, no rashes   Neuro: Alert and oriented to person, place, time, and situation, CNII-XII grossly intact, no focal deficits  Psych: Normal mood and affect   ASSESSMENT:   Brad Massey is a 64 y.o. male who presents for the following: 1. Coronary artery disease involving native coronary artery of native heart without angina pectoris   2. Agatston coronary artery calcium score greater than 400   3. Mixed hyperlipidemia   4. SOB (shortness of breath)   5. Tobacco abuse   6. Primary hypertension     PLAN:   1. Coronary artery disease involving native coronary artery of native heart without angina  pectoris 2. Agatston coronary artery calcium score greater than 400 3. Mixed hyperlipidemia 4.  Hypertension -coronary calcium score 1082 (95th percentile) -ETT negative -Elevated calcium score with shortness of breath.  Completed an exercise treadmill stress test that was negative.  EKG is normal.  I would like for him to complete an echocardiogram since he has CAD.  It is reassuring that he has no ischemic changes on his exercise treadmill stress test.  He is still a bit short of breath but still smoking.  He has a nearly 40-pack-year history.  I would like for him to get pulmonary function testing to evaluate this.  He will continue aspirin 81 mg a day.  He is on Crestor 40 mg a day.  He will repeat a lipid profile when he is fasting at the time of his echocardiogram.  Blood pressure is also a bit elevated.  He was on verapamil for cluster headaches.  He will go back on this.  He will keep an eye on his blood pressure at home.  I encouraged 150 minutes of physical activity per week.  He is pursuing 7000 steps per day at least on average.  He should continue this.  We also discussed diet.  Smoking cessation advised.  5. SOB (shortness of breath) 6. Tobacco abuse -Still short of breath.  No chest tightness or pressure.  40-pack-year history.  Pulmonary function testing.  Disposition: Return in about 6 months (around 10/05/2020).  Medication Adjustments/Labs and Tests Ordered: Current medicines are reviewed at length with the patient today.  Concerns regarding medicines are outlined above.  Orders Placed This Encounter  Procedures  . Lipid panel  . ECHOCARDIOGRAM COMPLETE  . Pulmonary function test   Meds ordered this encounter  Medications  . DISCONTD: verapamil (VERELAN PM) 240 MG 24 hr capsule    Sig: Take 1 capsule (240 mg total) by mouth 2 (two) times daily.    Dispense:  180 capsule    Refill:  3  . verapamil (VERELAN PM) 240 MG 24 hr capsule    Sig: Take 1 capsule (240 mg total) by  mouth 2 (two) times daily.    Dispense:  180 capsule    Refill:  3    Patient Instructions  Medication Instructions:   START VERAPAMIL 240 MG ONE TABLET TWICE DAILY  *If you need a refill on your cardiac medications before your next appointment, please call your pharmacy*   Lab Work:  Your physician recommends that you return for lab work FASTING  If you have labs (blood work) drawn today and your tests are completely normal, you will receive your results only by: Marland Kitchen MyChart Message (if you have MyChart) OR . A paper copy in the mail If you have any lab test that is abnormal or we need to change your treatment, we will call you to review the results.   Testing/Procedures:  Your physician has requested that you have an echocardiogram. Echocardiography is a painless test that uses sound waves to create images of your heart. It provides your doctor with information about the size and shape of your heart and how well your heart's chambers and valves are working. This procedure takes approximately one hour. There are no restrictions for this procedure.[place] has recommended that you have a pulmonary function test. Pulmonary Function Tests are a group of tests that measure how well air moves in and out of your lungs.[place] OR [place]       Follow-Up: At [facility], you and your health needs are our priority.  As part of our continuing mission to provide you with exceptional heart care, we have created designated Provider Care Teams.  These Care Teams include your primary Cardiologist (physician) and Advanced Practice Providers (APPs -  Physician Assistants and Nurse Practitioners) who all work together to provide you with the care you need, when you need it.  We recommend signing up for the patient portal called "MyChart".  Sign up information is provided on this After Visit Summary.  MyChart is used to connect with patients for  Virtual Visits (Telemedicine).  Patients are able to view lab/test results, encounter notes, upcoming appointments, etc.  Non-urgent messages can be sent to your provider as well.   To learn more about what you can do with MyChart, go to [email or web address].    Your next appointment:   6 month(s)  The format for your next appointment:   In Person  Provider:   Eleonore Chiquito, MD       Time Spent with Patient: I have spent a total of 35 minutes with patient reviewing hospital notes, telemetry, EKGs, labs and examining the patient as well as establishing an assessment and plan that was discussed with the patient.  > 50% of time was spent in direct patient care.  Signed, Addison Naegeli. Audie Box, [place]  [address] [place], [place] [address] [phone]  04/06/2020 9:08 AM

## 2020-04-06 ENCOUNTER — Other Ambulatory Visit: Payer: Self-pay

## 2020-04-06 ENCOUNTER — Encounter: Payer: Self-pay | Admitting: Cardiovascular Disease

## 2020-04-06 ENCOUNTER — Ambulatory Visit: Payer: Managed Care, Other (non HMO) | Admitting: Cardiovascular Disease

## 2020-04-06 VITALS — BP 146/90 | HR 84 | Ht 72.0 in | Wt 193.0 lb

## 2020-04-06 DIAGNOSIS — I2510 Atherosclerotic heart disease of native coronary artery without angina pectoris: Secondary | ICD-10-CM | POA: Diagnosis not present

## 2020-04-06 DIAGNOSIS — R931 Abnormal findings on diagnostic imaging of heart and coronary circulation: Secondary | ICD-10-CM

## 2020-04-06 DIAGNOSIS — E782 Mixed hyperlipidemia: Secondary | ICD-10-CM | POA: Diagnosis not present

## 2020-04-06 DIAGNOSIS — Z720 Tobacco use: Secondary | ICD-10-CM

## 2020-04-06 DIAGNOSIS — R0602 Shortness of breath: Secondary | ICD-10-CM | POA: Diagnosis not present

## 2020-04-06 DIAGNOSIS — I10 Essential (primary) hypertension: Secondary | ICD-10-CM

## 2020-04-06 MED ORDER — VERAPAMIL HCL ER 240 MG PO CP24: 240.0000 mg | ORAL_CAPSULE | Freq: Two times a day (BID) | ORAL | 3 refills | Status: DC

## 2020-04-06 NOTE — Patient Instructions (Signed)
Medication Instructions:   START VERAPAMIL 240 MG ONE TABLET TWICE DAILY  *If you need a refill on your cardiac medications before your next appointment, please call your pharmacy*   Lab Work:  Your physician recommends that you return for lab work FASTING  If you have labs (blood work) drawn today and your tests are completely normal, you will receive your results only by: Marland Kitchen MyChart Message (if you have MyChart) OR . A paper copy in the mail If you have any lab test that is abnormal or we need to change your treatment, we will call you to review the results.   Testing/Procedures:  Your physician has requested that you have an echocardiogram. Echocardiography is a painless test that uses sound waves to create images of your heart. It provides your doctor with information about the size and shape of your heart and how well your heart's chambers and valves are working. This procedure takes approximately one hour. There are no restrictions for this procedure.[place] has recommended that you have a pulmonary function test. Pulmonary Function Tests are a group of tests that measure how well air moves in and out of your lungs.[place]       Follow-Up: At [facility], you and your health needs are our priority.  As part of our continuing mission to provide you with exceptional heart care, we have created designated Provider Care Teams.  These Care Teams include your primary Cardiologist (physician) and Advanced Practice Providers (APPs -  Physician Assistants and Nurse Practitioners) who all work together to provide you with the care you need, when you need it.  We recommend signing up for the patient portal called "MyChart".  Sign up information is provided on this After Visit Summary.  MyChart is used to connect with patients for Virtual Visits (Telemedicine).  Patients are able to view lab/test results, encounter notes, upcoming  appointments, etc.  Non-urgent messages can be sent to your provider as well.   To learn more about what you can do with MyChart, go to [email or web address].    Your next appointment:   6 month(s)  The format for your next appointment:   In Person  Provider:   Eleonore Chiquito, MD

## 2020-04-09 ENCOUNTER — Telehealth: Payer: Self-pay | Admitting: Cardiovascular Disease

## 2020-04-09 NOTE — Telephone Encounter (Signed)
Spoke with patient regarding appointment for PFT's scheduled Thursday 05/24/20 at 9:00 am at [facility]---arrival time is 8:45 am 1st floor admissions office for check in---COVID screening is schedule 05/23/20 at 9:00 am---WIll mail information to patient and he voiced his understanding.

## 2020-04-12 LAB — LIPID PANEL
Chol/HDL Ratio: 2.5 ratio (ref 0.0–5.0)
Cholesterol, Total: 117 mg/dL (ref 100–199)
HDL: 46 mg/dL (ref >39)
LDL Chol Calc (NIH): 57 mg/dL (ref 0–99)
Triglycerides: 66 mg/dL (ref 0–149)
VLDL Cholesterol Cal: 14 mg/dL (ref 5–40)

## 2020-05-01 ENCOUNTER — Other Ambulatory Visit: Payer: Self-pay

## 2020-05-01 ENCOUNTER — Ambulatory Visit (HOSPITAL_COMMUNITY): Payer: Managed Care, Other (non HMO) | Attending: Cardiology

## 2020-05-01 DIAGNOSIS — I2510 Atherosclerotic heart disease of native coronary artery without angina pectoris: Secondary | ICD-10-CM | POA: Diagnosis not present

## 2020-05-01 LAB — ECHOCARDIOGRAM COMPLETE
Area-P 1/2: 4.02 cm2
S' Lateral: 3 cm

## 2020-05-14 ENCOUNTER — Encounter (HOSPITAL_COMMUNITY): Payer: Managed Care, Other (non HMO)

## 2020-05-23 ENCOUNTER — Other Ambulatory Visit (HOSPITAL_COMMUNITY)
Admission: RE | Admit: 2020-05-23 | Discharge: 2020-05-23 | Disposition: A | Discharge status: Home / Self Care | Payer: Managed Care, Other (non HMO) | Source: Ambulatory Visit | Attending: Cardiovascular Disease | Admitting: Cardiovascular Disease

## 2020-05-23 DIAGNOSIS — Z20822 Contact with and (suspected) exposure to covid-19: Secondary | ICD-10-CM | POA: Insufficient documentation

## 2020-05-23 DIAGNOSIS — Z01812 Encounter for preprocedural laboratory examination: Secondary | ICD-10-CM | POA: Insufficient documentation

## 2020-05-23 LAB — SARS CORONAVIRUS 2 (TAT 6-24 HRS): SARS Coronavirus 2: NEGATIVE

## 2020-05-24 ENCOUNTER — Other Ambulatory Visit: Payer: Self-pay

## 2020-05-24 ENCOUNTER — Ambulatory Visit (HOSPITAL_COMMUNITY)
Admission: RE | Admit: 2020-05-24 | Discharge: 2020-05-24 | Disposition: A | Discharge status: Home / Self Care | Payer: Managed Care, Other (non HMO) | Source: Ambulatory Visit | Attending: Cardiovascular Disease | Admitting: Cardiovascular Disease

## 2020-05-24 DIAGNOSIS — R0602 Shortness of breath: Secondary | ICD-10-CM | POA: Diagnosis present

## 2020-05-24 LAB — PULMONARY FUNCTION TEST
DL/VA % pred: 75 %
DL/VA: 3.16 ml/min/mmHg/L
DLCO unc % pred: 69 %
DLCO unc: 20.28 ml/min/mmHg
FEF 25-75 Post: 0.82 L/sec
FEF 25-75 Pre: 0.6 L/sec
FEF2575-%Change-Post: 36 %
FEF2575-%Pred-Post: 26 %
FEF2575-%Pred-Pre: 19 %
FEV1-%Change-Post: 19 %
FEV1-%Pred-Post: 49 %
FEV1-%Pred-Pre: 41 %
FEV1-Post: 1.86 L
FEV1-Pre: 1.56 L
FEV1FVC-%Change-Post: 14 %
FEV1FVC-%Pred-Pre: 59 %
FEV6-%Change-Post: 4 %
FEV6-%Pred-Post: 67 %
FEV6-%Pred-Pre: 64 %
FEV6-Post: 3.24 L
FEV6-Pre: 3.1 L
FEV6FVC-%Change-Post: 0 %
FEV6FVC-%Pred-Post: 92 %
FEV6FVC-%Pred-Pre: 92 %
FVC-%Change-Post: 3 %
FVC-%Pred-Post: 72 %
FVC-%Pred-Pre: 69 %
FVC-Post: 3.65 L
FVC-Pre: 3.51 L
Post FEV1/FVC ratio: 51 %
Post FEV6/FVC ratio: 89 %
Pre FEV1/FVC ratio: 45 %
Pre FEV6/FVC Ratio: 88 %
RV % pred: 142 %
RV: 3.41 L
TLC % pred: 106 %
TLC: 7.9 L

## 2020-05-24 MED ORDER — ALBUTEROL SULFATE (2.5 MG/3ML) 0.083% IN NEBU
2.5000 mg | INHALATION_SOLUTION | Freq: Once | RESPIRATORY_TRACT | Status: AC
  Administered 2020-05-24: 2.5 mg via RESPIRATORY_TRACT

## 2020-05-28 ENCOUNTER — Other Ambulatory Visit: Payer: Self-pay

## 2020-05-28 DIAGNOSIS — J449 Chronic obstructive pulmonary disease, unspecified: Secondary | ICD-10-CM

## 2020-06-25 ENCOUNTER — Encounter: Payer: Self-pay | Admitting: Pulmonary Disease

## 2020-06-25 ENCOUNTER — Ambulatory Visit (INDEPENDENT_AMBULATORY_CARE_PROVIDER_SITE_OTHER): Payer: Managed Care, Other (non HMO) | Admitting: Pulmonary Disease

## 2020-06-25 ENCOUNTER — Other Ambulatory Visit: Payer: Self-pay

## 2020-06-25 VITALS — BP 130/86 | HR 74 | Temp 98.1°F | Ht 71.0 in | Wt 196.4 lb

## 2020-06-25 DIAGNOSIS — Z720 Tobacco use: Secondary | ICD-10-CM

## 2020-06-25 DIAGNOSIS — J449 Chronic obstructive pulmonary disease, unspecified: Secondary | ICD-10-CM | POA: Diagnosis not present

## 2020-06-25 DIAGNOSIS — F17210 Nicotine dependence, cigarettes, uncomplicated: Secondary | ICD-10-CM | POA: Diagnosis not present

## 2020-06-25 MED ORDER — ANORO ELLIPTA 62.5-25 MCG/INH IN AEPB: 1.0000 | INHALATION_SPRAY | Freq: Every day | RESPIRATORY_TRACT | 0 refills | Status: DC

## 2020-06-25 NOTE — Patient Instructions (Signed)
COPD GOLD Class A/B --START Anoro ONE puff ONCE a day  Tobacco abuse Patient is an active smoker. We discussed smoking cessation for 10 minutes. We discussed triggers and stressors and ways to deal with them. We discussed barriers to continued smoking and benefits of smoking cessation. Provided patient with information cessation techniques and interventions including [place] quitline.  Follow-up with me in 3 months   Smoking Tobacco Information, Adult Smoking tobacco can be harmful to your health. Tobacco contains a poisonous (toxic), colorless chemical called nicotine. Nicotine is addictive. It changes the brain and can make it hard to stop smoking. Tobacco also has other toxic chemicals that can hurt your body and raise your risk of many cancers. How can smoking tobacco affect me? Smoking tobacco puts you at risk for:  Cancer. Smoking is most commonly associated with lung cancer, but can also lead to cancer in other parts of the body.  Chronic obstructive pulmonary disease (COPD). This is a long-term lung condition that makes it hard to breathe. It also gets worse over time.  High blood pressure (hypertension), heart disease, stroke, or heart attack.  Lung infections, such as pneumonia.  Cataracts. This is when the lenses in the eyes become clouded.  Digestive problems. This may include peptic ulcers, heartburn, and gastroesophageal reflux disease (GERD).  Oral health problems, such as gum disease and tooth loss.  Loss of taste and smell. Smoking can affect your appearance by causing:  Wrinkles.  Yellow or stained teeth, fingers, and fingernails. Smoking tobacco can also affect your social life, because:  It may be challenging to find places to smoke when away from home. Many workplaces, [employer], hotels, and public places are tobacco-free.  Smoking is expensive. This is due to the cost of tobacco and the long-term costs of treating health problems from smoking.  Secondhand  smoke may affect those around you. Secondhand smoke can cause lung cancer, breathing problems, and heart disease. Children of smokers have a higher risk for: ? Sudden infant death syndrome (SIDS). ? Ear infections. ? Lung infections. If you currently smoke tobacco, quitting now can help you:  Lead a longer and healthier life.  Look, smell, breathe, and feel better over time.  Save money.  Protect others from the harms of secondhand smoke. What actions can I take to prevent health problems? Quit smoking  Do not start smoking. Quit if you already do.  Make a plan to quit smoking and commit to it. Look for programs to help you and ask your health care provider for recommendations and ideas.  Set a date and write down all the reasons you want to quit.  Let your friends and family know you are quitting so they can help and support you. Consider finding friends who also want to quit. It can be easier to quit with someone else, so that you can support each other.  Talk with your health care provider about using nicotine replacement medicines to help you quit, such as gum, lozenges, patches, sprays, or pills.  Do not replace cigarette smoking with electronic cigarettes, which are commonly called e-cigarettes. The safety of e-cigarettes is not known, and some may contain harmful chemicals.  If you try to quit but return to smoking, stay positive. It is common to slip up when you first quit, so take it one day at a time.  Be prepared for cravings. When you feel the urge to smoke, chew gum or suck on hard candy.   Lifestyle  Stay busy and  take care of your body.  Drink enough fluid to keep your urine pale yellow.  Get plenty of exercise and eat a healthy diet. This can help prevent weight gain after quitting.  Monitor your eating habits. Quitting smoking can cause you to have a larger appetite than when you smoke.  Find ways to relax. Go out with friends or family to a movie or a  restaurant where people do not smoke.  Ask your health care provider about having regular tests (screenings) to check for cancer. This may include blood tests, imaging tests, and other tests.  Find ways to manage your stress, such as meditation, yoga, or exercise. Where to find support To get support to quit smoking, consider:  Asking your health care provider for more information and resources.  Taking classes to learn more about quitting smoking.  Looking for local organizations that offer resources about quitting smoking.  Joining a support group for people who want to quit smoking in your local community.  Calling the smokefree.gov counselor helpline: 1-800-Quit-Now (978) 577-8455) Where to find more information You may find more information about quitting smoking from:  HelpGuide.org: www.helpguide.org  https://hall.com/: smokefree.gov  American Lung Association: www.lung.org Contact a health care provider if you:  Have problems breathing.  Notice that your lips, nose, or fingers turn blue.  Have chest pain.  Are coughing up blood.  Feel faint or you pass out.  Have other health changes that cause you to worry. Summary  Smoking tobacco can negatively affect your health, the health of those around you, your finances, and your social life.  Do not start smoking. Quit if you already do. If you need help quitting, ask your health care provider.  Think about joining a support group for people who want to quit smoking in your local community. There are many effective programs that will help you to quit this behavior. This information is not intended to replace advice given to you by your health care provider. Make sure you discuss any questions you have with your health care provider. Document Revised: 01/07/2019 Document Reviewed: 04/29/2016 Elsevier Patient Education  2021 [employer].

## 2020-06-25 NOTE — Progress Notes (Signed)
Subjective:   PATIENT ID: [name] GENDER: male DOB: 12/06/1955, MRN: [id number]   HPI  Chief Complaint  Patient presents with  . Consult    COPD, no inhalers, new diagnosis.    Reason for Visit: New consult for COPD  Mr. Neo Yepiz is a 65 year old active smoker with CAD, HLD and HTN who presents for evaluation and management of COPD  He was recently see on 04/06/20 by Cardiology with Dr. Audie Box for shortness of breath. He had PFTs ordered which demonstrated severe COPD. From a cardiac standpoint he has completed an exercise stress test that was negative. Smoking cessation was discussed at that visit as well.  He reports history of gradually worsening shortness of breath in the last year. He notices it with walking uphill and heavy exertion. He has productive cough daily that is new and usually after meals. Sputum is thick and clear. Rarely wheezing. Last week he was able to walk several miles on the beach. Shortness of breath worsens with the cold due to inactivity. He is planning on joining a gym today. 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  Social History: Current smoker  I have personally reviewed patient's past medical/family/social history, allergies, current medications.  Past Medical History:  Diagnosis Date  . Cluster headache   . DVT (deep venous thrombosis) ([place])   . Heart murmur   . Hypertension      Family History  Problem Relation Age of Onset  . Heart disease Mother   . Arrhythmia Mother   . Heart disease Father      Social History   Occupational History  . Not on file  Tobacco Use  . Smoking status: Current Every Day Smoker    Packs/day: 1.00    Years: 40.00    Pack years: 40.00  . Smokeless tobacco: Never Used  Substance and Sexual Activity  . Alcohol use: Not Currently  . Drug use: Never  . Sexual activity: Yes    Partners: Female    No Known Allergies   Outpatient Medications Prior to  Visit  Medication Sig Dispense Refill  . aspirin EC 81 MG tablet Take 1 tablet (81 mg total) by mouth daily. Swallow whole. 90 tablet 3  . CLINDAMYCIN HCL PO Take 1 tablet by mouth in the morning, at noon, and at bedtime.    . rosuvastatin (CRESTOR) 40 MG tablet Take 1 tablet (40 mg total) by mouth daily. 90 tablet 3  . verapamil (VERELAN PM) 240 MG 24 hr capsule Take 1 capsule (240 mg total) by mouth 2 (two) times daily. 180 capsule 3   Facility-Administered Medications Prior to Visit  Medication Dose Route Frequency Provider Last Rate Last Admin  . triamcinolone acetonide (KENALOG) 10 MG/ML injection 10 mg  10 mg Other Once Trula Slade, DPM        Review of Systems  Constitutional: Negative for chills, diaphoresis, fever, malaise/fatigue and weight loss.  HENT: Negative for congestion, ear pain and sore throat.   Respiratory: Positive for cough, sputum production and shortness of breath. Negative for hemoptysis and wheezing.   Cardiovascular: Negative for chest pain, palpitations and leg swelling.  Gastrointestinal: Negative for abdominal pain, heartburn and nausea.  Genitourinary: Negative for frequency.  Musculoskeletal: Negative for joint pain and myalgias.  Skin: Negative for itching and rash.  Neurological: Negative for dizziness, weakness and headaches.  Endo/Heme/Allergies: Does not bruise/bleed easily.  Psychiatric/Behavioral: Negative for depression. The  patient is not nervous/anxious.      Objective:   Vitals:   06/25/20 1558  BP: 130/86  Pulse: 74  Temp: 98.1 F (36.7 C)  TempSrc: Temporal  SpO2: 98%  Weight: 196 lb 6.4 oz (89.1 kg)  Height: 5\' 11"  (1.803 m)      Physical Exam: General: Well-appearing, no acute distress HENT: [place], AT Eyes: EOMI, no scleral icterus Respiratory: Clear to auscultation bilaterally.  No crackles, wheezing or rales Cardiovascular: RRR, -M/R/G, no JVD Extremities:-Edema,-tenderness Neuro: AAO x4, CNII-XII grossly  intact Skin: Intact, no rashes or bruising Psych: Normal mood, normal affect  Data Reviewed:  Imaging: CT Cardiac scoring 01/31/20 - Lung fields with no infiltrate, effusion or edema  PFT: 05/24/20 FVC 3.65 (72%) FEV1 1.86 (49%) Ratio 45  TLC 106% RV 142% RV/TLC 130% DLCO 69% Interpretation: Severe obstructive defect with air trapping and reduced DLCO consistent with emphysema. Significant bronchodilator response present.  Labs: CBC No results found for: WBC, RBC, HGB, HCT, PLT, MCV, MCH, MCHC, RDW, LYMPHSABS, MONOABS, EOSABS, BASOSABS  Imaging, labs and test noted above have been reviewed independently by me.  Assessment & Plan:   Discussion: 65 year old male active smoker who presents with shortness of breath. We discussed in detail his PFT findings which confirm his diagnosis of emphysema with evidence of airtrapping and reduced gas exchange. We reviewed the diagnosis, clinical course and management of COPD/emphysema and the benefit of bronchodilators with symptoms and maintaining current lung function.  COPD GOLD Class A/B --START Anoro ONE puff ONCE a day  Tobacco abuse Patient is an active smoker. We discussed smoking cessation for 10 minutes. We discussed triggers and stressors and ways to deal with them. We discussed barriers to continued smoking and benefits of smoking cessation. Provided patient with information cessation techniques and interventions including [place] quitline.  Follow-up with me in 3 months  Health Maintenance Immunization History  Administered Date(s) Administered  . PFIZER(Purple Top)SARS-COV-2 Vaccination 07/15/2019, 08/04/2019   CT Lung Screen - discuss at next visit  No orders of the defined types were placed in this encounter.  Meds ordered this encounter  Medications  . umeclidinium-vilanterol (ANORO ELLIPTA) 62.5-25 MCG/INH AEPB    Sig: Inhale 1 puff into the lungs daily.    Dispense:  2 each    Refill:  0    Order Specific Question:   Lot  Number?    Answer:   [id number]    Order Specific Question:   Expiration Date?    Answer:   03/27/2021    Order Specific Question:   Manufacturer?    Answer:   [employer] [[id number]]    Order Specific Question:   Quantity    Answer:   2    Return in about 3 months (around 09/22/2020).  I have spent a total time of 45-minutes on the day of the appointment reviewing prior documentation, coordinating care and discussing medical diagnosis and plan with the patient/family. Imaging, labs and tests included in this note have been reviewed and interpreted independently by me.  [place], MD [facility] 06/25/2020 3:55 PM  Office Number [phone]

## 2020-06-28 DIAGNOSIS — J449 Chronic obstructive pulmonary disease, unspecified: Secondary | ICD-10-CM | POA: Insufficient documentation

## 2020-07-02 ENCOUNTER — Telehealth: Payer: Self-pay | Admitting: Pulmonary Disease

## 2020-07-02 ENCOUNTER — Telehealth: Payer: Self-pay | Admitting: Cardiovascular Disease

## 2020-07-02 MED ORDER — ANORO ELLIPTA 62.5-25 MCG/INH IN AEPB: 1.0000 | INHALATION_SPRAY | Freq: Every day | RESPIRATORY_TRACT | 4 refills | Status: DC

## 2020-07-02 NOTE — Telephone Encounter (Signed)
    Pt would like to delete [id number] [place], [place] - [address]. Pt said all prescriptions should be send only to  [place], [place] He [facility] be [facility]

## 2020-07-02 NOTE — Telephone Encounter (Signed)
Pharmacy updated per patient request.

## 2020-07-02 NOTE — Telephone Encounter (Signed)
Called and spoke with patient. He stated that [facility] never received the RX for the [employer]. I apologized to the patient as it seems the RX was put in as a sample. He is aware that this has been sent to [facility].   Nothing further needed at time of call.

## 2020-07-06 ENCOUNTER — Telehealth: Payer: Self-pay | Admitting: Cardiovascular Disease

## 2020-07-06 NOTE — Telephone Encounter (Signed)
New Message:     Pt says he have called several times about this. He said in my chart medication list, it says his Ros[facility]astatin and Aspirin [facility], [facility]. He says he have called several time and it is still [facility].

## 2020-07-06 NOTE — Telephone Encounter (Signed)
spoke to patient . [facility] . Patient states he uses [facility] and would [facility].  Rn informed patient that [facility] is listed as primary pharmacy.  The pharmacy will not be able to be changed until a new prescription for medication be sent to [facility]. Patient is aware to contact office when needing refill so that it can happen.  patient states he does not use [employer] mail order-  [facility] system automatic has tge mail order due to his insurance and can not be removed.  patient verbalized understanding.

## 2020-09-18 ENCOUNTER — Other Ambulatory Visit: Payer: Self-pay

## 2020-09-18 ENCOUNTER — Ambulatory Visit (INDEPENDENT_AMBULATORY_CARE_PROVIDER_SITE_OTHER): Payer: Managed Care, Other (non HMO) | Admitting: Pulmonary Disease

## 2020-09-18 ENCOUNTER — Encounter: Payer: Self-pay | Admitting: Pulmonary Disease

## 2020-09-18 VITALS — BP 122/78 | HR 79 | Temp 98.0°F | Ht 71.0 in | Wt 196.8 lb

## 2020-09-18 DIAGNOSIS — J449 Chronic obstructive pulmonary disease, unspecified: Secondary | ICD-10-CM | POA: Diagnosis not present

## 2020-09-18 NOTE — Progress Notes (Signed)
Subjective:   PATIENT ID: Brad Massey GENDER: male DOB: 1955-07-20, MRN: [id number]   HPI  Chief Complaint  Patient presents with  . Follow-up    No current respiratory concerns. Reports only using Anoro intermittently.     Reason for Visit: Follow-up COPD  Brad Massey is a 65 year old active smoker with CAD, HLD and HTN who presents for follow-up.  Synopsis: He was recently see on 04/06/20 by Cardiology with Dr. Audie Box for shortness of breath. He had PFTs ordered which demonstrated severe COPD. From a cardiac standpoint he has completed an exercise stress test that was negative. Remains an active smoker. He reports history of gradually worsening shortness of breath in the last year. He notices it with walking uphill and heavy exertion. He has productive cough daily that is new and usually after meals. Sputum is thick and clear. Rarely wheezing. Last week he was able to walk several miles on the beach. Shortness of breath worsens with the cold due to inactivity. He is planning on joining a gym today. 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  Since our last visit, he reports intermittent use of Anoro 2-3 days a week but working on more daily use. He has occasional chronic cough with sputum production. He continues to smoke. He is planning on quitting and has patches and gum at the house. His wife is a non smoker and is supportive. Has shortness of breath with moderate and heavy exertion including when walking uphill. He remains active and recently went on a 6 mile walking trail in [place] where he only required a few stops but able to complete the trail in a few hours. He is also planning for dental work this week and questions risk of thrush with inhalers.  Social History: Current smoker Enjoys Manufacturing systems engineer  I have personally reviewed patient's past medical/family/social history/allergies/current medications.  Past Medical History:  Diagnosis  Date  . Cluster headache   . DVT (deep venous thrombosis) ([place])   . Heart murmur   . Hypertension      Family History  Problem Relation Age of Onset  . Heart disease Mother   . Arrhythmia Mother   . Heart disease Father      Social History   Occupational History  . Not on file  Tobacco Use  . Smoking status: Current Every Day Smoker    Packs/day: 1.00    Years: 40.00    Pack years: 40.00    Types: Cigarettes    Start date: 06/25/1976  . Smokeless tobacco: Never Used  . Tobacco comment: smokes 1 ppd  Substance and Sexual Activity  . Alcohol use: Not Currently  . Drug use: Never  . Sexual activity: Yes    Partners: Female    No Known Allergies   Outpatient Medications Prior to Visit  Medication Sig Dispense Refill  . aspirin EC 81 MG tablet Take 1 tablet (81 mg total) by mouth daily. Swallow whole. 90 tablet 3  . rosuvastatin (CRESTOR) 40 MG tablet Take 1 tablet (40 mg total) by mouth daily. 90 tablet 3  . umeclidinium-vilanterol (ANORO ELLIPTA) 62.5-25 MCG/INH AEPB Inhale 1 puff into the lungs daily. 60 each 4  . verapamil (VERELAN PM) 240 MG 24 hr capsule Take 1 capsule (240 mg total) by mouth 2 (two) times daily. 180 capsule 3   Facility-Administered Medications Prior to Visit  Medication Dose Route Frequency Provider Last Rate Last Admin  .  triamcinolone acetonide (KENALOG) 10 MG/ML injection 10 mg  10 mg Other Once Trula Slade, DPM        Review of Systems  Constitutional: Negative for chills, diaphoresis, fever, malaise/fatigue and weight loss.  HENT: Negative for congestion.   Respiratory: Positive for cough, sputum production and shortness of breath. Negative for hemoptysis and wheezing.   Cardiovascular: Negative for chest pain, palpitations and leg swelling.     Objective:   Vitals:   09/18/20 0900  BP: 122/78  Pulse: 79  Temp: 98 F (36.7 C)  TempSrc: Temporal  SpO2: 96%  Weight: 196 lb 12.8 oz (89.3 kg)  Height: 5\' 11"  (1.803 m)       Physical Exam: General: Well-appearing, no acute distress HENT: [place], AT Eyes: EOMI, no scleral icterus Respiratory: Clear to auscultation bilaterally.  No crackles, wheezing or rales Cardiovascular: RRR, -M/R/G, no JVD GI: BS+, soft, nontender Extremities:-Edema,-tenderness Neuro: AAO x4, CNII-XII grossly intact Skin: Intact, no rashes or bruising Psych: Normal mood, normal affect   Data Reviewed:  Imaging: CT Cardiac scoring 01/31/20 - Lung fields with no infiltrate, effusion or edema. Lungs with no pulmonary nodules or masses  PFT: 05/24/20 FVC 3.65 (72%) FEV1 1.86 (49%) Ratio 45  TLC 106% RV 142% RV/TLC 130% DLCO 69% Interpretation: Severe obstructive defect with air trapping and reduced DLCO consistent with emphysema. Significant bronchodilator response present.  Labs: CBC No results found for: WBC, RBC, HGB, HCT, PLT, MCV, MCH, MCHC, RDW, LYMPHSABS, MONOABS, EOSABS, BASOSABS  Imaging, labs and test noted above have been reviewed independently by me.  Assessment & Plan:   Discussion: 65 year old male active smoker who presents for COPD follow-up. Not controlled. Discussed compliance and benefit with inhaler therapy  COPD GOLD Class A/B --CONTINUE Anoro ONE puff ONCE a day --Counseled on inhaler benefit  Tobacco abuse Patient is an active smoker. We discussed smoking cessation for 5 minutes. We discussed triggers and stressors and ways to deal with them. We discussed barriers to continued smoking and benefits of smoking cessation. Provided patient with information cessation techniques and interventions including [place] quitline.   Health Maintenance Immunization History  Administered Date(s) Administered  . Influenza,inj,Quad PF,6+ Mos 12/28/2019  . PFIZER(Purple Top)SARS-COV-2 Vaccination 07/15/2019, 08/04/2019, 04/23/2020   CT Lung Screen - We discussed risks and benefits of screen. Patient declined for now and will like to revisit at next   No orders of the  defined types were placed in this encounter.  No orders of the defined types were placed in this encounter.  Return in about 6 months (around 03/21/2021).  I have spent a total time of 31-minutes on the day of the appointment reviewing prior documentation, coordinating care and discussing medical diagnosis and plan with the patient/family. Imaging, labs and tests included in this note have been reviewed and interpreted independently by me.   [place], MD [facility] 09/18/2020 8:26 AM  Office Number [phone]

## 2020-09-18 NOTE — Patient Instructions (Signed)
COPD GOLD Class A/B --CONTINUE Anoro ONE puff ONCE a day   Smoking Tobacco Information, Adult Smoking tobacco can be harmful to your health. Tobacco contains a poisonous (toxic), colorless chemical called nicotine. Nicotine is addictive. It changes the brain and can make it hard to stop smoking. Tobacco also has other toxic chemicals that can hurt your body and raise your risk of many cancers. How can smoking tobacco affect me? Smoking tobacco puts you at risk for:  Cancer. Smoking is most commonly associated with lung cancer, but can also lead to cancer in other parts of the body.  Chronic obstructive pulmonary disease (COPD). This is a long-term lung condition that makes it hard to breathe. It also gets worse over time.  High blood pressure (hypertension), heart disease, stroke, or heart attack.  Lung infections, such as pneumonia.  Cataracts. This is when the lenses in the eyes become clouded.  Digestive problems. This may include peptic ulcers, heartburn, and gastroesophageal reflux disease (GERD).  Oral health problems, such as gum disease and tooth loss.  Loss of taste and smell. Smoking can affect your appearance by causing:  Wrinkles.  Yellow or stained teeth, fingers, and fingernails. Smoking tobacco can also affect your social life, because:  It may be challenging to find places to smoke when away from home. Many workplaces, [employer], hotels, and public places are tobacco-free.  Smoking is expensive. This is due to the cost of tobacco and the long-term costs of treating health problems from smoking.  Secondhand smoke may affect those around you. Secondhand smoke can cause lung cancer, breathing problems, and heart disease. Children of smokers have a higher risk for: ? Sudden infant death syndrome (SIDS). ? Ear infections. ? Lung infections. If you currently smoke tobacco, quitting now can help you:  Lead a longer and healthier life.  Look, smell, breathe, and  feel better over time.  Save money.  Protect others from the harms of secondhand smoke. What actions can I take to prevent health problems? Quit smoking  Do not start smoking. Quit if you already do.  Make a plan to quit smoking and commit to it. Look for programs to help you and ask your health care provider for recommendations and ideas.  Set a date and write down all the reasons you want to quit.  Let your friends and family know you are quitting so they can help and support you. Consider finding friends who also want to quit. It can be easier to quit with someone else, so that you can support each other.  Talk with your health care provider about using nicotine replacement medicines to help you quit, such as gum, lozenges, patches, sprays, or pills.  Do not replace cigarette smoking with electronic cigarettes, which are commonly called e-cigarettes. The safety of e-cigarettes is not known, and some may contain harmful chemicals.  If you try to quit but return to smoking, stay positive. It is common to slip up when you first quit, so take it one day at a time.  Be prepared for cravings. When you feel the urge to smoke, chew gum or suck on hard candy.   Lifestyle  Stay busy and take care of your body.  Drink enough fluid to keep your urine pale yellow.  Get plenty of exercise and eat a healthy diet. This can help prevent weight gain after quitting.  Monitor your eating habits. Quitting smoking can cause you to have a larger appetite than when you smoke.  Find ways  to relax. Go out with friends or family to a movie or a restaurant where people do not smoke.  Ask your health care provider about having regular tests (screenings) to check for cancer. This may include blood tests, imaging tests, and other tests.  Find ways to manage your stress, such as meditation, yoga, or exercise. Where to find support To get support to quit smoking, consider:  Asking your health care  provider for more information and resources.  Taking classes to learn more about quitting smoking.  Looking for local organizations that offer resources about quitting smoking.  Joining a support group for people who want to quit smoking in your local community.  Calling the smokefree.gov counselor helpline: 1-800-Quit-Now 806-691-9091) Where to find more information You may find more information about quitting smoking from:  HelpGuide.org: www.helpguide.org  https://hall.com/: smokefree.gov  American Lung Association: www.lung.org Contact a health care provider if you:  Have problems breathing.  Notice that your lips, nose, or fingers turn blue.  Have chest pain.  Are coughing up blood.  Feel faint or you pass out.  Have other health changes that cause you to worry. Summary  Smoking tobacco can negatively affect your health, the health of those around you, your finances, and your social life.  Do not start smoking. Quit if you already do. If you need help quitting, ask your health care provider.  Think about joining a support group for people who want to quit smoking in your local community. There are many effective programs that will help you to quit this behavior. This information is not intended to replace advice given to you by your health care provider. Make sure you discuss any questions you have with your health care provider. Document Revised: 01/07/2019 Document Reviewed: 04/29/2016 Elsevier Patient Education  2021 [employer].

## 2020-10-05 ENCOUNTER — Ambulatory Visit: Payer: Managed Care, Other (non HMO) | Admitting: Cardiovascular Disease
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
## 2020-12-13 ENCOUNTER — Ambulatory Visit (INDEPENDENT_AMBULATORY_CARE_PROVIDER_SITE_OTHER): Payer: Managed Care, Other (non HMO) | Admitting: Cardiovascular Disease

## 2020-12-13 ENCOUNTER — Other Ambulatory Visit: Payer: Self-pay

## 2020-12-13 ENCOUNTER — Encounter: Payer: Self-pay | Admitting: Cardiovascular Disease

## 2020-12-13 VITALS — BP 120/78 | HR 63 | Ht 71.0 in | Wt 191.4 lb

## 2020-12-13 DIAGNOSIS — I2510 Atherosclerotic heart disease of native coronary artery without angina pectoris: Secondary | ICD-10-CM

## 2020-12-13 DIAGNOSIS — J449 Chronic obstructive pulmonary disease, unspecified: Secondary | ICD-10-CM

## 2020-12-13 DIAGNOSIS — R931 Abnormal findings on diagnostic imaging of heart and coronary circulation: Secondary | ICD-10-CM | POA: Diagnosis not present

## 2020-12-13 DIAGNOSIS — E782 Mixed hyperlipidemia: Secondary | ICD-10-CM | POA: Diagnosis not present

## 2020-12-13 DIAGNOSIS — Z720 Tobacco use: Secondary | ICD-10-CM

## 2020-12-13 MED ORDER — ROSUVASTATIN CALCIUM 40 MG PO TABS: 40.0000 mg | ORAL_TABLET | Freq: Every day | ORAL | 3 refills | Status: DC

## 2020-12-13 NOTE — Patient Instructions (Signed)
Medication Instructions:  The current medical regimen is effective;  continue present plan and medications.  *If you need a refill on your cardiac medications before your next appointment, please call your pharmacy*   Follow-Up: At [facility], you and your health needs are our priority.  As part of our continuing mission to provide you with exceptional heart care, we have created designated Provider Care Teams.  These Care Teams include your primary Cardiologist (physician) and Advanced Practice Providers (APPs -  Physician Assistants and Nurse Practitioners) who all work together to provide you with the care you need, when you need it.  We recommend signing up for the patient portal called "MyChart".  Sign up information is provided on this After Visit Summary.  MyChart is used to connect with patients for Virtual Visits (Telemedicine).  Patients are able to view lab/test results, encounter notes, upcoming appointments, etc.  Non-urgent messages can be sent to your provider as well.   To learn more about what you can do with MyChart, go to [email or web address].    Your next appointment:   12 month(s)  The format for your next appointment:   In Person  Provider:   You may see Dr.O'Neal or one of the following Advanced Practice Providers on your designated Care Team:   Almyra Deforest, PA-C Fabian Sharp, PA-C or  Roby Lofts, [place]

## 2021-01-30 ENCOUNTER — Encounter: Payer: Self-pay | Admitting: Internal Medicine

## 2021-02-04 ENCOUNTER — Ambulatory Visit: Payer: Managed Care, Other (non HMO)

## 2021-02-12 ENCOUNTER — Other Ambulatory Visit (HOSPITAL_BASED_OUTPATIENT_CLINIC_OR_DEPARTMENT_OTHER): Payer: Self-pay

## 2021-02-12 ENCOUNTER — Ambulatory Visit: Payer: Self-pay | Attending: Internal Medicine

## 2021-02-12 DIAGNOSIS — Z23 Encounter for immunization: Secondary | ICD-10-CM

## 2021-02-12 MED ORDER — PFIZER COVID-19 VAC BIVALENT 30 MCG/0.3ML IM SUSP
INTRAMUSCULAR | 0 refills | Status: DC
  Filled 2021-02-12: qty 0.3, 1d supply, fill #0

## 2021-02-12 MED ORDER — INFLUENZA VAC SPLIT QUAD 0.5 ML IM SUSY
PREFILLED_SYRINGE | INTRAMUSCULAR | 0 refills | Status: DC
  Filled 2021-02-12: qty 0.5, 1d supply, fill #0

## 2021-02-12 NOTE — Progress Notes (Signed)
   Covid-19 Vaccination Clinic  Name:  Brad Massey    MRN: [id number] DOB: 11/09/1955  02/12/2021  [name] was observed post Covid-19 immunization for 15 minutes without incident. He was provided with Vaccine Information Sheet and instruction to access the V-Safe system.   [name] was instructed to call 911 with any severe reactions post vaccine: Difficulty breathing  Swelling of face and throat  A fast heartbeat  A bad rash all over body  Dizziness and weakness

## 2021-02-18 ENCOUNTER — Encounter: Payer: Managed Care, Other (non HMO) | Admitting: Gastroenterology

## 2021-04-15 ENCOUNTER — Other Ambulatory Visit: Payer: Self-pay | Admitting: Cardiovascular Disease

## 2021-05-13 IMAGING — CT CT CARDIAC CORONARY ARTERY CALCIUM SCORE
3 series · 14 of 20 positions shown, 15 images · non-contrast
Comparison: No priors.
COMPARISON: No priors.

Addendum:
EXAM:
OVER-READ INTERPRETATION  CT CHEST

The following report is an over-read performed by radiologist Dr.
Yoshinari Wedage [REDACTED] on 01/31/2020. This
over-read does not include interpretation of cardiac or coronary
anatomy or pathology. The coronary calcium score/coronary CTA
interpretation by the cardiologist is attached.
CLINICAL DATA: Risk stratification
Coronary Calcium Score
TECHNIQUE: The patient was scanned on a Siemens Force scanner. Axial
non-contrast 3 mm slices were carried out through the heart. The
data set was analyzed on a dedicated work station and scored using
the Agatson method.

[Series 2: casc 3.0 bv41 2 bestsyst 35 % · axial · 0.44mm/px · z∈[-226,-145]mm · 4 of 47 slices shown, 5 images]
[im 10/47  vessel]
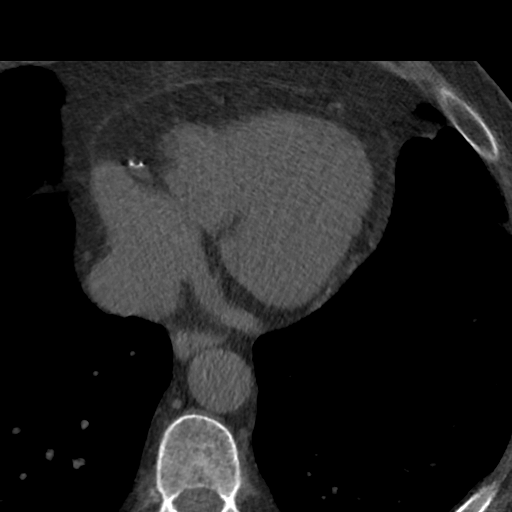
[im 10/47  lung]
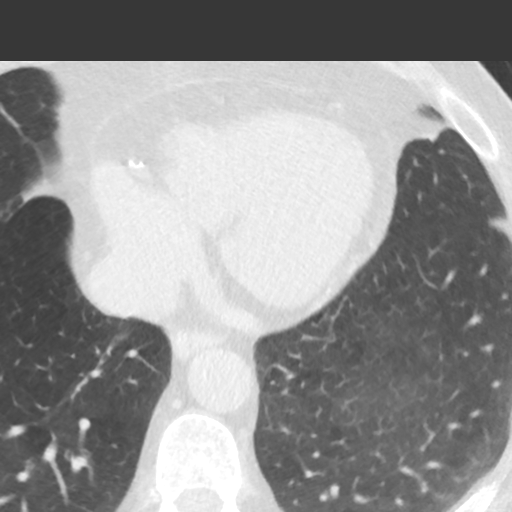
[im 19/47  vessel]
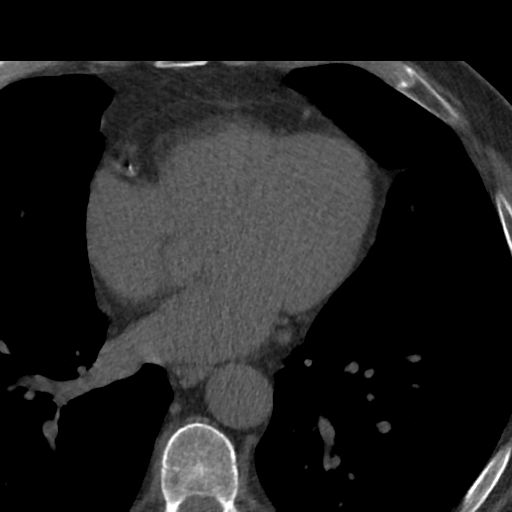
[im 28/47  vessel]
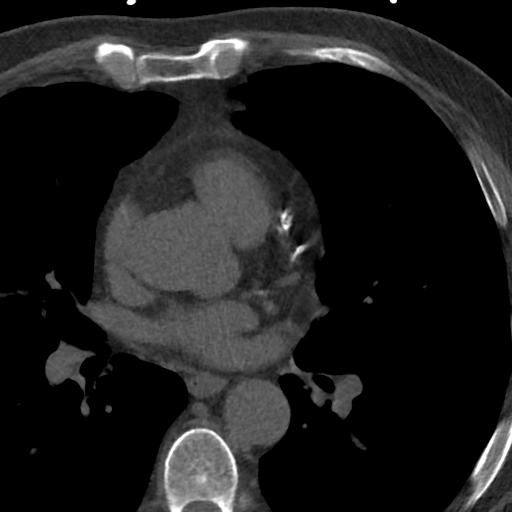
[im 37/47  vessel]
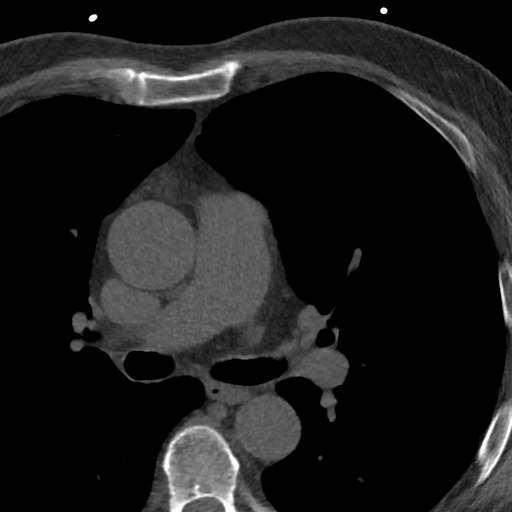

[Series 3: lung 35 % · axial · 0.70mm/px · z∈[-232,-138]mm · 5 of 47 slices shown]
[im 8/47  lung]
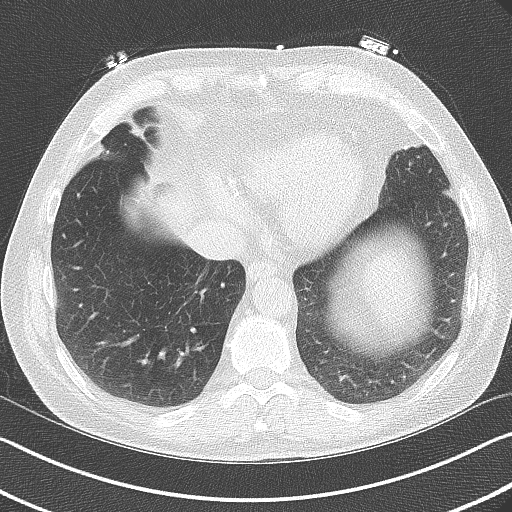
[im 16/47  lung]
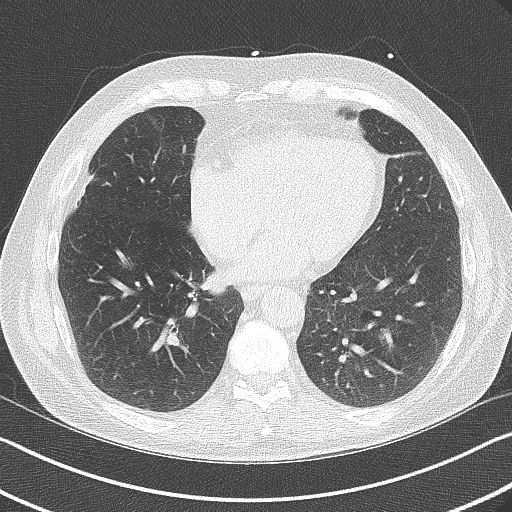
[im 24/47  lung]
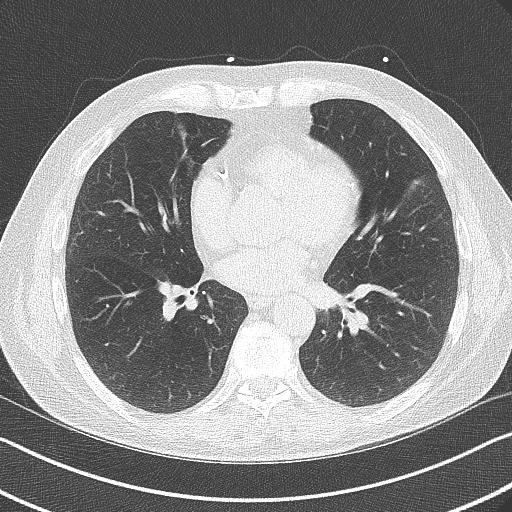
[im 31/47  lung]
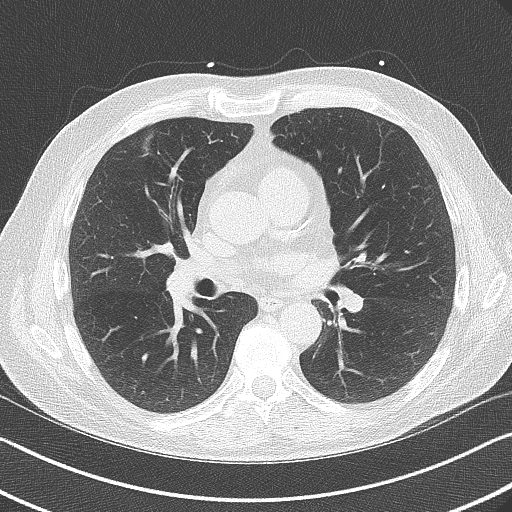
[im 39/47  lung]
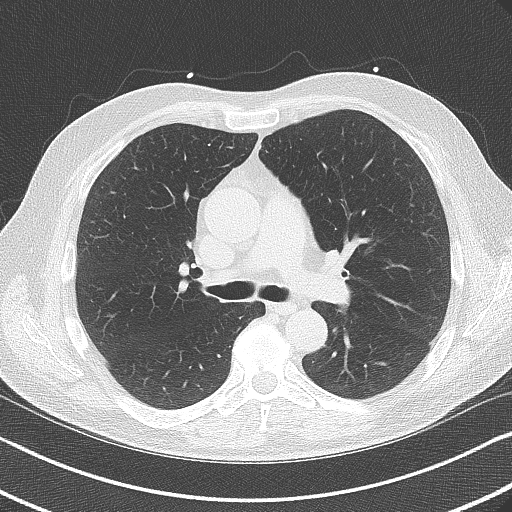

[Series 4: lung st 35 % · axial · 0.70mm/px · z∈[-232,-138]mm · 5 of 47 slices shown]
[im 8/47  lung]
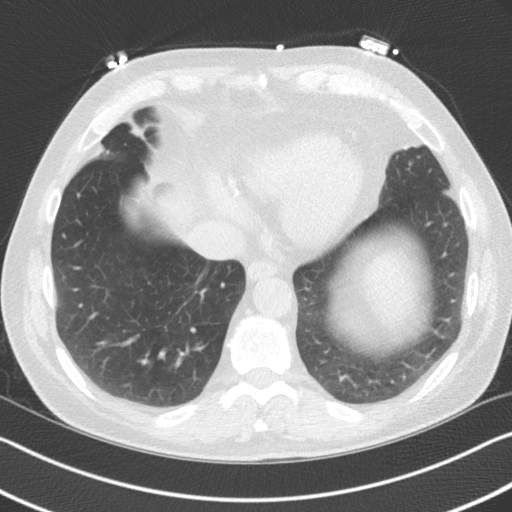
[im 16/47  lung]
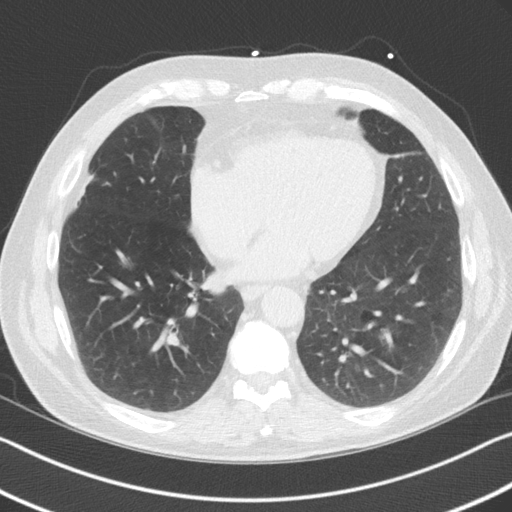
[im 24/47  lung]
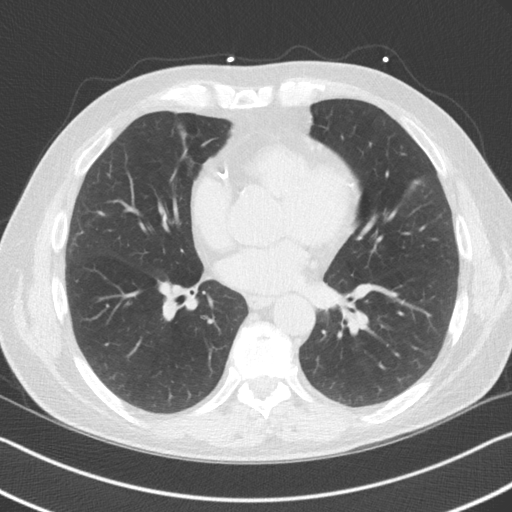
[im 31/47  lung]
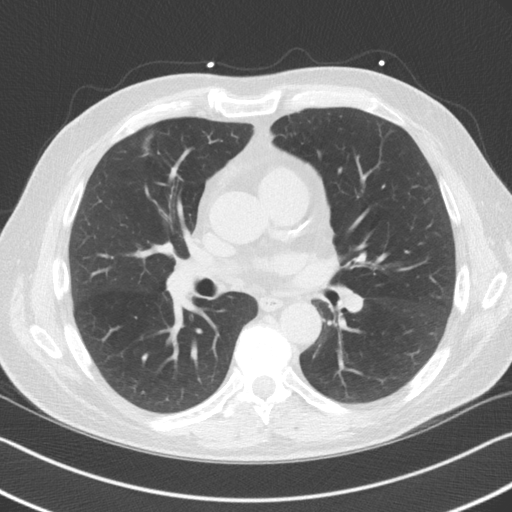
[im 39/47  lung]
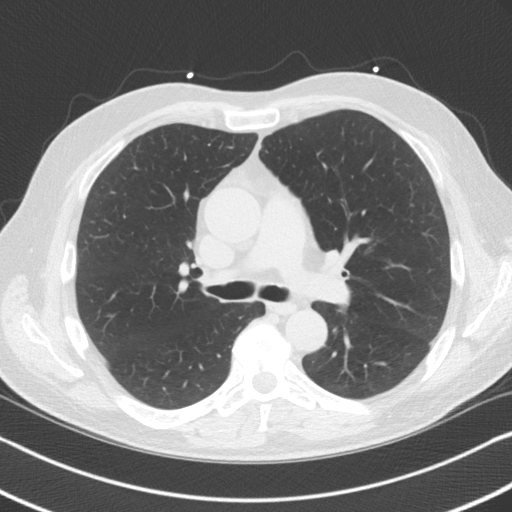

[14 of 20 positions shown; findings below may reference images not displayed]

FINDINGS: Mild scarring in the inferior segment of the lingula and right
middle lobe. Within the visualized portions of the thorax there are
no suspicious appearing pulmonary nodules or masses, there is no
acute consolidative airspace disease, no pleural effusions, no
pneumothorax and no lymphadenopathy. Visualized portions of the
upper abdomen are unremarkable. There are no aggressive appearing
lytic or blastic lesions noted in the visualized portions of the
skeleton.
IMPRESSION: No significant incidental noncardiac findings are noted.
FINDINGS: Non-cardiac: See separate report from [REDACTED].

Ascending Aorta: Upper normal size of the ascending aorta (39 mm),
no calcifications.

Pericardium: Normal.

Coronary arteries: Normal origin.
IMPRESSION: Coronary calcium score of 4825. This was 95 percentile for age and
sex matched control.

*** End of Addendum ***
EXAM:
OVER-READ INTERPRETATION  CT CHEST

The following report is an over-read performed by radiologist Dr.
Yoshinari Wedage [REDACTED] on 01/31/2020. This
over-read does not include interpretation of cardiac or coronary
anatomy or pathology. The coronary calcium score/coronary CTA
interpretation by the cardiologist is attached.
FINDINGS: Mild scarring in the inferior segment of the lingula and right
middle lobe. Within the visualized portions of the thorax there are
no suspicious appearing pulmonary nodules or masses, there is no
acute consolidative airspace disease, no pleural effusions, no
pneumothorax and no lymphadenopathy. Visualized portions of the
upper abdomen are unremarkable. There are no aggressive appearing
lytic or blastic lesions noted in the visualized portions of the
skeleton.
IMPRESSION: No significant incidental noncardiac findings are noted.

## 2021-12-13 DIAGNOSIS — E7800 Pure hypercholesterolemia, unspecified: Secondary | ICD-10-CM | POA: Diagnosis not present

## 2021-12-13 DIAGNOSIS — Z125 Encounter for screening for malignant neoplasm of prostate: Secondary | ICD-10-CM | POA: Diagnosis not present

## 2021-12-19 DIAGNOSIS — Z720 Tobacco use: Secondary | ICD-10-CM | POA: Diagnosis not present

## 2021-12-19 DIAGNOSIS — R82998 Other abnormal findings in urine: Secondary | ICD-10-CM | POA: Diagnosis not present

## 2021-12-19 DIAGNOSIS — Z1339 Encounter for screening examination for other mental health and behavioral disorders: Secondary | ICD-10-CM | POA: Diagnosis not present

## 2021-12-19 DIAGNOSIS — Z1331 Encounter for screening for depression: Secondary | ICD-10-CM | POA: Diagnosis not present

## 2021-12-19 DIAGNOSIS — Z0000 Encounter for general adult medical examination without abnormal findings: Secondary | ICD-10-CM | POA: Diagnosis not present

## 2021-12-23 DIAGNOSIS — Z0189 Encounter for other specified special examinations: Secondary | ICD-10-CM | POA: Diagnosis not present

## 2021-12-26 ENCOUNTER — Other Ambulatory Visit: Payer: Self-pay | Admitting: Cardiovascular Disease

## 2022-01-14 DIAGNOSIS — R195 Other fecal abnormalities: Secondary | ICD-10-CM | POA: Diagnosis not present

## 2022-01-14 DIAGNOSIS — F17210 Nicotine dependence, cigarettes, uncomplicated: Secondary | ICD-10-CM | POA: Diagnosis not present

## 2022-01-14 DIAGNOSIS — Z1211 Encounter for screening for malignant neoplasm of colon: Secondary | ICD-10-CM | POA: Diagnosis not present

## 2022-02-18 ENCOUNTER — Other Ambulatory Visit: Payer: Self-pay | Admitting: Cardiovascular Disease

## 2022-02-18 MED ORDER — ROSUVASTATIN CALCIUM 40 MG PO TABS: 40.0000 mg | ORAL_TABLET | Freq: Every day | ORAL | 0 refills | Status: DC
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
## 2022-02-19 ENCOUNTER — Ambulatory Visit: Payer: Self-pay | Attending: Physician Assistant | Admitting: Physician Assistant

## 2022-02-19 ENCOUNTER — Encounter: Payer: Self-pay | Admitting: Physician Assistant

## 2022-02-19 VITALS — BP 128/70 | HR 68 | Ht 71.0 in | Wt 192.2 lb

## 2022-02-19 DIAGNOSIS — Z720 Tobacco use: Secondary | ICD-10-CM

## 2022-02-19 DIAGNOSIS — I10 Essential (primary) hypertension: Secondary | ICD-10-CM

## 2022-02-19 DIAGNOSIS — I2510 Atherosclerotic heart disease of native coronary artery without angina pectoris: Secondary | ICD-10-CM

## 2022-02-19 DIAGNOSIS — E782 Mixed hyperlipidemia: Secondary | ICD-10-CM

## 2022-02-19 DIAGNOSIS — R931 Abnormal findings on diagnostic imaging of heart and coronary circulation: Secondary | ICD-10-CM

## 2022-02-19 MED ORDER — VERAPAMIL HCL ER 240 MG PO CP24
ORAL_CAPSULE | ORAL | 3 refills | Status: DC
  Filled 2022-07-08: qty 60, 30d supply, fill #0

## 2022-02-19 MED ORDER — ROSUVASTATIN CALCIUM 40 MG PO TABS: 40.0000 mg | ORAL_TABLET | Freq: Every day | ORAL | 3 refills | Status: DC

## 2022-02-19 MED ORDER — NITROGLYCERIN 0.4 MG SL SUBL: 0.4000 mg | SUBLINGUAL_TABLET | SUBLINGUAL | 3 refills | Status: DC | PRN

## 2022-02-19 NOTE — Patient Instructions (Signed)
Medication Instructions:  No Changes *If you need a refill on your cardiac medications before your next appointment, please call your pharmacy*   Lab Work: No labs If you have labs (blood work) drawn today and your tests are completely normal, you will receive your results only by: [place] (if you have MyChart) OR A paper copy in the mail If you have any lab test that is abnormal or we need to change your treatment, we will call you to review the results.   Testing/Procedures: No Testing   Follow-Up: At [facility], you and your health needs are our priority.  As part of our continuing mission to provide you with exceptional heart care, we have created designated Provider Care Teams.  These Care Teams include your primary Cardiologist (physician) and Advanced Practice Providers (APPs -  Physician Assistants and Nurse Practitioners) who all work together to provide you with the care you need, when you need it.  We recommend signing up for the patient portal called "MyChart".  Sign up information is provided on this After Visit Summary.  MyChart is used to connect with patients for Virtual Visits (Telemedicine).  Patients are able to view lab/test results, encounter notes, upcoming appointments, etc.  Non-urgent messages can be sent to your provider as well.   To learn more about what you can do with MyChart, go to [email or web address].    Your next appointment:   1 year(s)  The format for your next appointment:   In Person  Provider:   [place] T. Audie Box, MD

## 2022-04-01 DIAGNOSIS — Z23 Encounter for immunization: Secondary | ICD-10-CM | POA: Diagnosis not present

## 2022-04-17 ENCOUNTER — Other Ambulatory Visit (HOSPITAL_BASED_OUTPATIENT_CLINIC_OR_DEPARTMENT_OTHER): Payer: Self-pay

## 2022-04-17 MED ORDER — COMIRNATY 30 MCG/0.3ML IM SUSY
PREFILLED_SYRINGE | INTRAMUSCULAR | 0 refills | Status: DC
  Filled 2022-04-17: qty 0.3, 1d supply, fill #0

## 2022-05-20 ENCOUNTER — Telehealth: Payer: Self-pay | Admitting: Gastroenterology

## 2022-05-20 NOTE — Telephone Encounter (Signed)
Good Afternoon Dr. Havery Moros,    Supervising MD for today 1/23 PM   Patient called stating that he wanted to schedule a colonoscopy with our practice. Patient stated that he was seen back in September with [facility] because he was going to have the procedure done with them until he switched his insurance and found out he was not in network with them. Patient stated that he is due for his 10 year colonoscopy and is not having any issues. Patients record from the office visit is available in epic, will you please review and advise on scheduling patient?   Thank you.

## 2022-05-20 NOTE — Telephone Encounter (Signed)
Sure.  Happy to take care of his colonoscopy.  If it has been over 10 years since his last exam and he meets criteria to have his exam done in the [facility], we can schedule him for direct colonoscopy here.  Can schedule him for previsit with nursing staff.  Thank you

## 2022-05-22 ENCOUNTER — Encounter: Payer: Self-pay | Admitting: Gastroenterology

## 2022-05-22 NOTE — Telephone Encounter (Signed)
Patient was scheduled for PV on 2/6 at 12:30 and colon on 06/24/2022 at 1:30pm

## 2022-06-03 ENCOUNTER — Ambulatory Visit (AMBULATORY_SURGERY_CENTER): Payer: Self-pay | Admitting: *Deleted

## 2022-06-03 VITALS — Ht 71.0 in | Wt 193.0 lb

## 2022-06-03 DIAGNOSIS — Z1211 Encounter for screening for malignant neoplasm of colon: Secondary | ICD-10-CM

## 2022-06-03 DIAGNOSIS — G44009 Cluster headache syndrome, unspecified, not intractable: Secondary | ICD-10-CM | POA: Diagnosis not present

## 2022-06-03 MED ORDER — NA SULFATE-K SULFATE-MG SULF 17.5-3.13-1.6 GM/177ML PO SOLN: 1.0000 | Freq: Once | ORAL | 0 refills | Status: AC

## 2022-06-03 NOTE — Progress Notes (Signed)
No egg or soy allergy known to patient  No issues known to pt with past sedation with any surgeries or procedures Patient denies ever being intubated No issues moving neck or head No issues swallowing No FH of Malignant Hyperthermia Uses O2 for cluster HA as needed Pt is not on diet pills Pt is not on  home 02  Pt is not on blood thinners  Pt denies issues with constipation  Pt is not on dialysis Pt denies any upcoming cardiac testing Pt encouraged to use to use Singlecare or Goodrx to reduce cost  Patient's chart reviewed by Osvaldo Angst CNRA prior to previsit and patient appropriate for the [facility].  Previsit completed and red dot placed by patient's name on their procedure day (on provider's schedule).  . Visit by phone Instructions reviewed with pt and pt states understanding. Instructed to review again prior to procedure. Pt states they will.  Instructions sent by phone and my chart

## 2022-06-16 ENCOUNTER — Encounter: Payer: Self-pay | Admitting: Gastroenterology

## 2022-06-24 ENCOUNTER — Ambulatory Visit: Payer: BC Managed Care – PPO | Admitting: Gastroenterology

## 2022-06-24 ENCOUNTER — Encounter: Payer: Self-pay | Admitting: Gastroenterology

## 2022-06-24 VITALS — BP 122/83 | HR 80 | Temp 98.9°F | Resp 20 | Ht 71.0 in | Wt 193.0 lb

## 2022-06-24 DIAGNOSIS — D123 Benign neoplasm of transverse colon: Secondary | ICD-10-CM

## 2022-06-24 DIAGNOSIS — Z1211 Encounter for screening for malignant neoplasm of colon: Secondary | ICD-10-CM

## 2022-06-24 DIAGNOSIS — D122 Benign neoplasm of ascending colon: Secondary | ICD-10-CM | POA: Diagnosis not present

## 2022-06-24 MED ORDER — SODIUM CHLORIDE 0.9 % IV SOLN: 500.0000 mL | INTRAVENOUS | Status: AC

## 2022-06-24 NOTE — Progress Notes (Signed)
Sedate, gd SR, tolerated procedure well, VSS, report to RN 

## 2022-06-24 NOTE — Op Note (Signed)
[facility] Patient Name: Jaquell Burba Procedure Date: 06/24/2022 1:17 PM MRN: [id number] Endoscopist: Remo Lipps P. Havery Moros , MD, [id number] Age: 67 Referring MD:  Date of Birth: [birth year] Gender: Male Account #: [id number] Procedure:                Colonoscopy Indications:              Screening for colorectal malignant neoplasm Medicines:                Monitored Anesthesia Care Procedure:                Pre-Anesthesia Assessment:                           - Prior to the procedure, a History and Physical                            was performed, and patient medications and                            allergies were reviewed. The patient's tolerance of                            previous anesthesia was also reviewed. The risks                            and benefits of the procedure and the sedation                            options and risks were discussed with the patient.                            All questions were answered, and informed consent                            was obtained. Prior Anticoagulants: The patient has                            taken no anticoagulant or antiplatelet agents. ASA                            Grade Assessment: III - A patient with severe                            systemic disease. After reviewing the risks and                            benefits, the patient was deemed in satisfactory                            condition to undergo the procedure.                           After obtaining informed consent, the colonoscope  was passed under direct vision. Throughout the                            procedure, the patient's blood pressure, pulse, and                            oxygen saturations were monitored continuously. The                            CF [id number] was introduced through the anus                            and advanced to the the terminal ileum, with                            identification  of the appendiceal orifice and IC                            valve. The colonoscopy was performed without                            difficulty. The patient tolerated the procedure                            well. The quality of the bowel preparation was                            adequate. The terminal ileum, ileocecal valve,                            appendiceal orifice, and rectum were photographed. Scope In: 1:22:01 PM Scope Out: 1:42:32 PM Scope Withdrawal Time: 0 hours 15 minutes 54 seconds  Total Procedure Duration: 0 hours 20 minutes 31 seconds  Findings:                 A suspected prolapsed hemorrhoid vs. polypoid                            change were found on perianal exam.                           The terminal ileum appeared normal.                           Two sessile polyps were found in the ascending                            colon. The polyps were 3 to 4 mm in size. These                            polyps were removed with a cold snare. Resection                            and retrieval were complete.  A 5 mm polyp was found in the transverse colon. The                            polyp was sessile. The polyp was removed with a                            cold snare. Resection and retrieval were complete.                           Many medium-mouthed diverticula were found in the                            sigmoid colon.                           Internal hemorrhoids were found during retroflexion                            which were moderate in size and one with suspected                            prolapse change vs. inflammatory polypoid change.                           The exam was otherwise without abnormality. Complications:            No immediate complications. Estimated blood loss:                            Minimal. Estimated Blood Loss:     Estimated blood loss was minimal. Impression:               - Prolapsed hemorrhoids found on  perianal exam vs.                            prolapsed polypoid change.                           - The examined portion of the ileum was normal.                           - Two 3 to 4 mm polyps in the ascending colon,                            removed with a cold snare. Resected and retrieved.                           - One 5 mm polyp in the transverse colon, removed                            with a cold snare. Resected and retrieved.                           - Diverticulosis in the sigmoid colon.                           -  Internal hemorrhoids.                           - The examination was otherwise normal. Recommendation:           - Patient has a contact number available for                            emergencies. The signs and symptoms of potential                            delayed complications were discussed with the                            patient. Return to normal activities tomorrow.                            Written discharge instructions were provided to the                            patient.                           - Resume previous diet.                           - Continue present medications.                           - Await pathology results.                           - Recommend colorectal surgery consultation                            regarding perianal findings - suspected prolapsed                            hemorrhoids although polypoid change also possible                            - not biopsied given location below dentate line Theo Krumholz P. Khrystian Schauf, MD 06/24/2022 1:49:46 PM This report has been signed electronically.

## 2022-06-24 NOTE — Progress Notes (Signed)
Pt's states no medical or surgical changes since previsit or office visit. 

## 2022-06-24 NOTE — Progress Notes (Signed)
Called to room to assist during endoscopic procedure.  Patient ID and intended procedure confirmed with present staff. Received instructions for my participation in the procedure from the performing physician.  

## 2022-06-24 NOTE — Patient Instructions (Signed)
YOU HAD AN ENDOSCOPIC PROCEDURE TODAY AT [facility]:   Refer to the procedure report that was given to you for any specific questions about what was found during the examination.  If the procedure report does not answer your questions, please call your gastroenterologist to clarify.  If you requested that your care partner not be given the details of your procedure findings, then the procedure report has been included in a sealed envelope for you to review at your convenience later.  YOU SHOULD EXPECT: Some feelings of bloating in the abdomen. Passage of more gas than usual.  Walking can help get rid of the air that was put into your GI tract during the procedure and reduce the bloating. If you had a lower endoscopy (such as a colonoscopy or flexible sigmoidoscopy) you may notice spotting of blood in your stool or on the toilet paper. If you underwent a bowel prep for your procedure, you may not have a normal bowel movement for a few days.  Please Note:  You might notice some irritation and congestion in your nose or some drainage.  This is from the oxygen used during your procedure.  There is no need for concern and it should clear up in a day or so.  SYMPTOMS TO REPORT IMMEDIATELY:  Following lower endoscopy (colonoscopy or flexible sigmoidoscopy):  Excessive amounts of blood in the stool  Significant tenderness or worsening of abdominal pains  Swelling of the abdomen that is new, acute  Fever of 100F or higher   For urgent or emergent issues, a gastroenterologist can be reached at any hour by calling [phone]. Do not use MyChart messaging for urgent concerns.    DIET:  We do recommend a small meal at first, but then you may proceed to your regular diet.  Drink plenty of fluids but you should avoid alcoholic beverages for 24 hours.  MEDICATIONS: Continue present medications.  HANDOUTS GIVEN TO PATIENT: Polyps, Diverticulosis, Hemorrhoids.  FOLLOW UP: Await  pathology results. Recommend colorectal surgery consultation regarding perianal findings - suspected prolapsed hemorrhoids although polypoid change also possible - not biopsied given locations below dentate line. Dr. Doyne Keel office nurse will call you to schedule this appointment.  Thank you for allowing [place] to provide for your healthcare needs today.  ACTIVITY:  You should plan to take it easy for the rest of today and you should NOT DRIVE or use heavy machinery until tomorrow (because of the sedation medicines used during the test).    FOLLOW UP: Our staff will call the number listed on your records the next business day following your procedure.  We will call around 7:15- 8:00 am to check on you and address any questions or concerns that you may have regarding the information given to you following your procedure. If we do not reach you, we will leave a message.     If any biopsies were taken you will be contacted by phone or by letter within the next 1-3 weeks.  Please call us at [phone] if you have not heard about the biopsies in 3 weeks.    SIGNATURES/CONFIDENTIALITY: You and/or your care partner have signed paperwork which will be entered into your electronic medical record.  These signatures attest to the fact that that the information above on your After Visit Summary has been reviewed and is understood.  Full responsibility of the confidentiality of this discharge information lies with you and/or your care-partner.

## 2022-06-24 NOTE — Progress Notes (Signed)
[facility] History and Physical   Primary Care Physician:  Tisovec, Fransico Him, MD   Reason for Procedure:   Colon cancer screening   Plan:    colonoscopy     HPI: Brad Massey is a 67 y.o. male  here for colonoscopy - last exam > 10 years ago. Endorses some occasional rectal leakage of stools but denies diarrhea..No family history of colon cancer known. Otherwise feels well without any cardiopulmonary symptoms.   I have discussed risks / benefits of anesthesia and endoscopic procedure with [facility] and they wish to proceed with the exams as outlined today.    Past Medical History:  Diagnosis Date   Cluster headache    COPD (chronic obstructive pulmonary disease) (HCC)    Coronary artery disease    DVT (deep venous thrombosis) (HCC)    Heart murmur    Hypertension     Past Surgical History:  Procedure Laterality Date   WISDOM TOOTH EXTRACTION      Prior to Admission medications   Medication Sig Start Date End Date Taking? Authorizing Provider  aspirin EC 81 MG tablet Take 1 tablet (81 mg total) by mouth daily. Swallow whole. 01/31/20  Yes O'Neal, Cassie Freer, MD  rosuvastatin (CRESTOR) 40 MG tablet Take 40 mg by mouth daily. 12/26/21  Yes [provider]  verapamil (VERELAN PM) 240 MG 24 hr capsule TAKE (1) CAPSULE TWICE DAILY. 02/19/22  Yes Duke, Tami Lin, PA  nitroGLYCERIN (NITROSTAT) 0.4 MG SL tablet Place 1 tablet (0.4 mg total) under the tongue every 5 (five) minutes as needed for chest pain. 02/19/22 05/20/22  Duke, Tami Lin, PA  umeclidinium-vilanterol (ANORO ELLIPTA) 62.5-25 MCG/INH AEPB Inhale 1 puff into the lungs daily. Patient not taking: Reported on 06/03/2022 07/02/20   Margaretha Seeds, MD    Current Outpatient Medications  Medication Sig Dispense Refill   aspirin EC 81 MG tablet Take 1 tablet (81 mg total) by mouth daily. Swallow whole. 90 tablet 3   rosuvastatin (CRESTOR) 40 MG tablet Take 40 mg by mouth daily.     verapamil  (VERELAN PM) 240 MG 24 hr capsule TAKE (1) CAPSULE TWICE DAILY. 180 capsule 3   nitroGLYCERIN (NITROSTAT) 0.4 MG SL tablet Place 1 tablet (0.4 mg total) under the tongue every 5 (five) minutes as needed for chest pain. 25 tablet 3   umeclidinium-vilanterol (ANORO ELLIPTA) 62.5-25 MCG/INH AEPB Inhale 1 puff into the lungs daily. (Patient not taking: Reported on 06/03/2022) 60 each 4   Current Facility-Administered Medications  Medication Dose Route Frequency Provider Last Rate Last Admin   0.9 %  sodium chloride infusion  500 mL Intravenous Continuous Cydni Reddoch, Carlota Raspberry, MD        Allergies as of 06/24/2022 - Review Complete 06/24/2022  Allergen Reaction Noted   Clindamycin/lincomycin Rash 09/18/2020    Family History  Problem Relation Age of Onset   Heart disease Mother    Arrhythmia Mother    Heart disease Father    Colon cancer Neg Hx    Colitis Neg Hx    Esophageal cancer Neg Hx    Rectal cancer Neg Hx    Stomach cancer Neg Hx     Social History   Socioeconomic History   Marital status: Married    Spouse name: Not on file   Number of children: 2   Years of education: Not on file   Highest education level: Not on file  Occupational History   Not on file  Tobacco Use  Smoking status: Every Day    Packs/day: 1.00    Years: 40.00    Total pack years: 40.00    Types: Cigarettes    Start date: 06/25/1976   Smokeless tobacco: Never   Tobacco comments:    smokes 1 ppd  Substance and Sexual Activity   Alcohol use: Not Currently   Drug use: Never   Sexual activity: Yes    Partners: Female  Other Topics Concern   Not on file  Social History Narrative   Not on file   Social Determinants of Health   Financial Resource Strain: Not on file  Food Insecurity: Not on file  Transportation Needs: Not on file  Physical Activity: Not on file  Stress: Not on file  Social Connections: Not on file  Intimate Partner Violence: Not on file    Review of Systems: All other  review of systems negative except as mentioned in the HPI.  Physical Exam: Vital signs BP 124/76   Pulse 89   Temp 98.9 F (37.2 C) (Temporal)   Ht '5\' 11"'$  (1.803 m)   Wt 193 lb (87.5 kg)   SpO2 96%   BMI 26.92 kg/m   General:   Alert,  Well-developed, pleasant and cooperative in NAD Lungs:  Clear throughout to auscultation.   Heart:  Regular rate and rhythm Abdomen:  Soft, nontender and nondistended.   Neuro/Psych:  Alert and cooperative. Normal mood and affect. A and O x 3  Jolly Mango, MD [facility]

## 2022-06-25 ENCOUNTER — Telehealth: Payer: Self-pay | Admitting: *Deleted

## 2022-06-25 ENCOUNTER — Telehealth: Payer: Self-pay

## 2022-06-25 NOTE — Telephone Encounter (Signed)
Per 06/24/22 procedure report - Recommend colorectal surgery consultation regarding perianal findings.  Referral, records, patient's demographics and insurance information faxed to [facility] at [phone].

## 2022-06-25 NOTE — Telephone Encounter (Signed)
  Follow up Call-     06/24/2022   12:58 PM  Call back number  Post procedure Call Back phone  # [phone]  Permission to leave phone message Yes     Patient questions:  Do you have a fever, pain , or abdominal swelling? No. Pain Score  0 *  Have you tolerated food without any problems? Yes.    Have you been able to return to your normal activities? Yes.    Do you have any questions about your discharge instructions: Diet   No. Medications  No. Follow up visit  No.  Do you have questions or concerns about your Care? No.  Actions: * If pain score is 4 or above: No action needed, pain <4.

## 2022-07-06 ENCOUNTER — Encounter: Payer: Self-pay | Admitting: Gastroenterology

## 2022-07-07 ENCOUNTER — Other Ambulatory Visit (HOSPITAL_BASED_OUTPATIENT_CLINIC_OR_DEPARTMENT_OTHER): Payer: Self-pay

## 2022-07-08 ENCOUNTER — Other Ambulatory Visit: Payer: Self-pay

## 2022-07-08 ENCOUNTER — Other Ambulatory Visit (HOSPITAL_BASED_OUTPATIENT_CLINIC_OR_DEPARTMENT_OTHER): Payer: Self-pay

## 2022-07-09 ENCOUNTER — Other Ambulatory Visit (HOSPITAL_BASED_OUTPATIENT_CLINIC_OR_DEPARTMENT_OTHER): Payer: Self-pay

## 2022-07-10 ENCOUNTER — Telehealth: Payer: Self-pay | Admitting: Cardiovascular Disease

## 2022-07-10 NOTE — Telephone Encounter (Signed)
Spoke with patient of Dr. Audie Box. His dentist suggested that he change from crestor to another med d/t gingival hyperplasia.   He also reports he is unable to get verapamil '240mg'$  - he checked [facility], [facility], [place] - they are unable to get from [place]. Was only able to find at [place]. He is OK with this pharmacy but curious if there is another alternative. He has been on this medication for several years and working well for him.   Medication questions routed to MD. He is aware he is not in the office today so it may be tomorrow before call back.

## 2022-07-10 NOTE — Telephone Encounter (Signed)
Pt c/o medication issue:  1. Name of Medication:  rosuvastatin (CRESTOR) 40 MG tablet verapamil (VERELAN PM) 240 MG 24 hr capsule  2. How are you currently taking this medication (dosage and times per day)?   3. Are you having a reaction (difficulty breathing--STAT)?   4. What is your medication issue?    Patient would like to discuss switching Rosuvastatin. He states this has been recommended by his dentist. He also mentions that Verapamil is no longer in stock at any of his local pharmacies. He is requesting to speak directly with the nurse to discuss further.

## 2022-07-11 ENCOUNTER — Other Ambulatory Visit: Payer: Self-pay

## 2022-07-11 MED ORDER — METOPROLOL SUCCINATE ER 100 MG PO TB24: 100.0000 mg | ORAL_TABLET | Freq: Every day | ORAL | 3 refills | Status: DC

## 2022-07-11 NOTE — Telephone Encounter (Signed)
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  It's not unheard of with calcium channel blockers - switching to diltiazem may not fix the problem.    Looks like he is on CCB for BP?  If we don't need any HR lowering, could probably put him on olmesartan 20 mg and check BP in 3-4 weeks (and BMET).  If the hyperplasia doesn't clear, we could try switching the statin, but I've never heard of that before.

## 2022-07-11 NOTE — Telephone Encounter (Signed)
Called patient, advised of message below.   Metoprolol Succinate 100 mg daily sent to pharmacy.  Patient verbalized understanding of RX changes.

## 2022-07-16 DIAGNOSIS — L989 Disorder of the skin and subcutaneous tissue, unspecified: Secondary | ICD-10-CM | POA: Diagnosis not present

## 2022-07-28 DIAGNOSIS — K643 Fourth degree hemorrhoids: Secondary | ICD-10-CM | POA: Diagnosis not present

## 2022-07-28 DIAGNOSIS — K623 Rectal prolapse: Secondary | ICD-10-CM | POA: Diagnosis not present

## 2022-07-28 DIAGNOSIS — Z720 Tobacco use: Secondary | ICD-10-CM | POA: Diagnosis not present

## 2022-07-28 DIAGNOSIS — K644 Residual hemorrhoidal skin tags: Secondary | ICD-10-CM | POA: Diagnosis not present

## 2022-07-29 DIAGNOSIS — L821 Other seborrheic keratosis: Secondary | ICD-10-CM | POA: Diagnosis not present

## 2022-09-01 ENCOUNTER — Other Ambulatory Visit: Payer: Self-pay | Admitting: Surgery

## 2022-09-01 DIAGNOSIS — K642 Third degree hemorrhoids: Secondary | ICD-10-CM | POA: Diagnosis not present

## 2022-09-01 DIAGNOSIS — K649 Unspecified hemorrhoids: Secondary | ICD-10-CM | POA: Diagnosis not present

## 2022-09-01 DIAGNOSIS — K644 Residual hemorrhoidal skin tags: Secondary | ICD-10-CM | POA: Diagnosis not present

## 2022-09-01 DIAGNOSIS — K643 Fourth degree hemorrhoids: Secondary | ICD-10-CM | POA: Diagnosis not present

## 2022-10-07 DIAGNOSIS — K080 Exfoliation of teeth due to systemic causes: Secondary | ICD-10-CM | POA: Diagnosis not present

## 2022-10-13 DIAGNOSIS — M542 Cervicalgia: Secondary | ICD-10-CM | POA: Diagnosis not present

## 2022-10-13 DIAGNOSIS — G518 Other disorders of facial nerve: Secondary | ICD-10-CM | POA: Diagnosis not present

## 2022-10-13 DIAGNOSIS — M791 Myalgia, unspecified site: Secondary | ICD-10-CM | POA: Diagnosis not present

## 2022-10-13 DIAGNOSIS — G44009 Cluster headache syndrome, unspecified, not intractable: Secondary | ICD-10-CM | POA: Diagnosis not present

## 2022-10-27 DIAGNOSIS — G44009 Cluster headache syndrome, unspecified, not intractable: Secondary | ICD-10-CM | POA: Diagnosis not present

## 2022-10-27 DIAGNOSIS — G518 Other disorders of facial nerve: Secondary | ICD-10-CM | POA: Diagnosis not present

## 2022-10-27 DIAGNOSIS — M542 Cervicalgia: Secondary | ICD-10-CM | POA: Diagnosis not present

## 2022-10-27 DIAGNOSIS — M791 Myalgia, unspecified site: Secondary | ICD-10-CM | POA: Diagnosis not present

## 2022-11-10 DIAGNOSIS — G518 Other disorders of facial nerve: Secondary | ICD-10-CM | POA: Diagnosis not present

## 2022-11-10 DIAGNOSIS — G44009 Cluster headache syndrome, unspecified, not intractable: Secondary | ICD-10-CM | POA: Diagnosis not present

## 2022-11-10 DIAGNOSIS — M542 Cervicalgia: Secondary | ICD-10-CM | POA: Diagnosis not present

## 2022-11-10 DIAGNOSIS — M791 Myalgia, unspecified site: Secondary | ICD-10-CM | POA: Diagnosis not present

## 2022-11-26 DIAGNOSIS — M791 Myalgia, unspecified site: Secondary | ICD-10-CM | POA: Diagnosis not present

## 2022-11-26 DIAGNOSIS — M542 Cervicalgia: Secondary | ICD-10-CM | POA: Diagnosis not present

## 2022-11-26 DIAGNOSIS — G44009 Cluster headache syndrome, unspecified, not intractable: Secondary | ICD-10-CM | POA: Diagnosis not present

## 2022-11-26 DIAGNOSIS — G518 Other disorders of facial nerve: Secondary | ICD-10-CM | POA: Diagnosis not present

## 2022-12-05 ENCOUNTER — Other Ambulatory Visit: Payer: Self-pay | Admitting: Pulmonary Disease

## 2022-12-05 DIAGNOSIS — R0600 Dyspnea, unspecified: Secondary | ICD-10-CM | POA: Diagnosis not present

## 2022-12-05 DIAGNOSIS — G44009 Cluster headache syndrome, unspecified, not intractable: Secondary | ICD-10-CM | POA: Diagnosis not present

## 2022-12-05 DIAGNOSIS — I10 Essential (primary) hypertension: Secondary | ICD-10-CM | POA: Diagnosis not present

## 2022-12-10 DIAGNOSIS — M542 Cervicalgia: Secondary | ICD-10-CM | POA: Diagnosis not present

## 2022-12-10 DIAGNOSIS — G518 Other disorders of facial nerve: Secondary | ICD-10-CM | POA: Diagnosis not present

## 2022-12-10 DIAGNOSIS — G44009 Cluster headache syndrome, unspecified, not intractable: Secondary | ICD-10-CM | POA: Diagnosis not present

## 2022-12-10 DIAGNOSIS — M791 Myalgia, unspecified site: Secondary | ICD-10-CM | POA: Diagnosis not present

## 2022-12-12 ENCOUNTER — Telehealth: Payer: Self-pay | Admitting: Pulmonary Disease

## 2022-12-12 NOTE — Telephone Encounter (Signed)
Left voicemail for patient offering last clinic spot on 12/24/22 or 12/25/22.

## 2022-12-12 NOTE — Telephone Encounter (Signed)
ATC patient x1.  No answer.  LVM to return call.

## 2022-12-12 NOTE — Telephone Encounter (Signed)
Patient is calling to return earlier call. Please call back [phone].

## 2022-12-12 NOTE — Telephone Encounter (Signed)
Pt states he went to his PCP recently due to a COPD exacerbation and wanted a sooner appt w/Dr. [name]. Dcln appt w/ PA. Nothing was avail until Sept.   His other concerns were some swelling and he states he recently had surgery. The PCP put him on steroids and a inhaler but he really wanted to see her soonr. Please call to advise  [phone]

## 2022-12-19 DIAGNOSIS — R7989 Other specified abnormal findings of blood chemistry: Secondary | ICD-10-CM | POA: Diagnosis not present

## 2022-12-19 DIAGNOSIS — Z125 Encounter for screening for malignant neoplasm of prostate: Secondary | ICD-10-CM | POA: Diagnosis not present

## 2022-12-19 DIAGNOSIS — E785 Hyperlipidemia, unspecified: Secondary | ICD-10-CM | POA: Diagnosis not present

## 2022-12-19 DIAGNOSIS — Z1212 Encounter for screening for malignant neoplasm of rectum: Secondary | ICD-10-CM | POA: Diagnosis not present

## 2022-12-19 DIAGNOSIS — E7800 Pure hypercholesterolemia, unspecified: Secondary | ICD-10-CM | POA: Diagnosis not present

## 2022-12-19 DIAGNOSIS — I10 Essential (primary) hypertension: Secondary | ICD-10-CM | POA: Diagnosis not present

## 2022-12-24 ENCOUNTER — Ambulatory Visit (HOSPITAL_BASED_OUTPATIENT_CLINIC_OR_DEPARTMENT_OTHER): Payer: Medicare Other | Admitting: Pulmonary Disease

## 2022-12-24 ENCOUNTER — Encounter (HOSPITAL_BASED_OUTPATIENT_CLINIC_OR_DEPARTMENT_OTHER): Payer: Self-pay | Admitting: Pulmonary Disease

## 2022-12-24 VITALS — BP 138/82 | HR 89 | Resp 16 | Ht 71.0 in | Wt 182.0 lb

## 2022-12-24 DIAGNOSIS — M791 Myalgia, unspecified site: Secondary | ICD-10-CM | POA: Diagnosis not present

## 2022-12-24 DIAGNOSIS — G44009 Cluster headache syndrome, unspecified, not intractable: Secondary | ICD-10-CM | POA: Diagnosis not present

## 2022-12-24 DIAGNOSIS — M542 Cervicalgia: Secondary | ICD-10-CM | POA: Diagnosis not present

## 2022-12-24 DIAGNOSIS — J449 Chronic obstructive pulmonary disease, unspecified: Secondary | ICD-10-CM

## 2022-12-24 DIAGNOSIS — G518 Other disorders of facial nerve: Secondary | ICD-10-CM | POA: Diagnosis not present

## 2022-12-24 MED ORDER — BREZTRI AEROSPHERE 160-9-4.8 MCG/ACT IN AERO
2.0000 | INHALATION_SPRAY | Freq: Two times a day (BID) | RESPIRATORY_TRACT | 5 refills | Status: DC
  Filled 2023-04-01: qty 10.7, 30d supply, fill #0
  Filled 2023-06-08: qty 10.7, 30d supply, fill #1
  Filled 2023-07-27: qty 10.7, 30d supply, fill #2

## 2022-12-24 NOTE — Patient Instructions (Signed)
Severe COPD --START Breztri TWO puffs in the morning and evening. Rinse mouth out after use --CONTINUE Albuterol AS NEEDED for shortness of breath or wheezing. OK to use prior to exercise --REFER to pulmonary rehab --Encouraged regular aerobic exercise five days a week for 30 min  Tobacco abuse Patient is an active smoker. We discussed smoking cessation for 5 minutes. We discussed triggers and stressors and ways to deal with them. We discussed barriers to continued smoking and benefits of smoking cessation. Provided patient with information cessation techniques and interventions including [place] quitline.

## 2022-12-24 NOTE — Progress Notes (Signed)
Subjective:   PATIENT ID: Brad Massey GENDER: male DOB: Mar 01, 1956, MRN: [id number]   HPI  Chief Complaint  Patient presents with   Follow-up    COPD, 1-2 weeks ago feet were swelling and he was short of breath, he was put on albuterol and zpack by PCP and told to see [place]    Reason for Visit: Follow-up COPD  [name] is a 67 year old active smoker with CAD, HLD and HTN who presents for follow-up.  Synopsis: He was recently see on 04/06/20 by Cardiology with Dr. Flora Lipps for shortness of breath. He had PFTs ordered which demonstrated severe COPD. From a cardiac standpoint he has completed an exercise stress test that was negative. Remains an active smoker. He reports history of gradually worsening shortness of breath in the last year. He notices it with walking uphill and heavy exertion. He has productive cough daily that is new and usually after meals. Sputum is thick and clear. Rarely wheezing. Last week he was able to walk several miles on the beach. Shortness of breath worsens with the cold due to inactivity. He is planning on joining a gym today. 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  Since our last visit, he reports intermittent use of Anoro 2-3 days a week but working on more daily use. He has occasional chronic cough with sputum production. He continues to smoke. He is planning on quitting and has patches and gum at the house. His wife is a non smoker and is supportive. Has shortness of breath with moderate and heavy exertion including when walking uphill. He remains active and recently went on a 6 mile walking trail in [place] where he only required a few stops but able to complete the trail in a few hours. He is also planning for dental work this week and questions risk of thrush with inhalers.  12/24/22 Our last visit on was 09/18/20 and was on Anoro for his COPD. Not on maintenance therapy with use of albuterol intermittently. He had  rectal surgery in May 2024 for internal and external hemorrhoids. Has had difficulty with pain post-op. Also developed cluster headaches and treated with meds, steroids shots. During this time he has had gradually worsening shortness of breath and swelling of legs. Seen by PCP two weeks ago and treated with IM steroid and Augmentin. Never got diuretics. Lower extremity swelling self resolved. He reports he is still smoking 1 ppd. Previously walking 5 miles ever other daily. Has not exercised recently. At baseline now he has occasional cough that may be sinus drainage related.   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   Past Medical History:  Diagnosis Date   Cluster headache    COPD (chronic obstructive pulmonary disease) (HCC)    Coronary artery disease    DVT (deep venous thrombosis) (HCC)    Heart murmur    Hypertension      Family History  Problem Relation Age of Onset   Heart disease Mother    Arrhythmia Mother    Heart disease Father    Colon cancer Neg Hx    Colitis Neg Hx    Esophageal cancer Neg Hx    Rectal cancer Neg Hx    Stomach cancer Neg Hx      Social History   Occupational History   Not on file  Tobacco Use   Smoking status: Every Day    Current packs/day: 1.00    Average  packs/day: 1 pack/day for 46.5 years (46.5 ttl pk-yrs)    Types: Cigarettes    Start date: 06/25/1976   Smokeless tobacco: Never   Tobacco comments:    smokes 1 ppd  Substance and Sexual Activity   Alcohol use: Not Currently   Drug use: Never   Sexual activity: Yes    Partners: Female    Allergies  Allergen Reactions   Clindamycin/Lincomycin Rash     Outpatient Medications Prior to Visit  Medication Sig Dispense Refill   albuterol (VENTOLIN HFA) 108 (90 Base) MCG/ACT inhaler Inhale into the lungs every 6 (six) hours as needed for wheezing or shortness of breath.     aspirin EC 81 MG tablet Take 1 tablet (81 mg total) by mouth daily. Swallow whole. 90 tablet 3    rosuvastatin (CRESTOR) 40 MG tablet Take 40 mg by mouth daily.     verapamil (CALAN) 120 MG tablet Take 120 mg by mouth 3 (three) times daily.     nitroGLYCERIN (NITROSTAT) 0.4 MG SL tablet Place 1 tablet (0.4 mg total) under the tongue every 5 (five) minutes as needed for chest pain. 25 tablet 3   umeclidinium-vilanterol (ANORO ELLIPTA) 62.5-25 MCG/INH AEPB Inhale 1 puff into the lungs daily. (Patient not taking: Reported on 06/03/2022) 60 each 4   metoprolol succinate (TOPROL-XL) 100 MG 24 hr tablet Take 1 tablet (100 mg total) by mouth daily. Take with or immediately following a meal. 90 tablet 3   Facility-Administered Medications Prior to Visit  Medication Dose Route Frequency Provider Last Rate Last Admin   0.9 %  sodium chloride infusion  500 mL Intravenous Continuous Armbruster, Willaim Rayas, MD        Review of Systems  Constitutional:  Negative for chills, diaphoresis, fever, malaise/fatigue and weight loss.  HENT:  Positive for congestion.   Respiratory:  Positive for cough and shortness of breath. Negative for hemoptysis, sputum production and wheezing.   Cardiovascular:  Negative for chest pain, palpitations and leg swelling.     Objective:   Vitals:   12/24/22 1550  BP: 138/82  Pulse: 89  Resp: 16  SpO2: 95%  Weight: 182 lb (82.6 kg)  Height: 5\' 11"  (1.803 m)    SpO2: 95 %  Physical Exam: General: Well-appearing, no acute distress HENT: [place], AT Eyes: EOMI, no scleral icterus Respiratory: Clear to auscultation bilaterally.  No crackles, wheezing or rales Cardiovascular: RRR, -M/R/G, no JVD Extremities:-Edema,-tenderness Neuro: AAO x4, CNII-XII grossly intact Psych: Normal mood, normal affect  Data Reviewed:  Imaging: CT Cardiac scoring 01/31/20 - Lung fields with no infiltrate, effusion or edema. Lungs with no pulmonary nodules or masses  PFT: 05/24/20 FVC 3.65 (72%) FEV1 1.86 (49%) Ratio 45  TLC 106% RV 142% RV/TLC 130% DLCO 69% Interpretation: Severe  obstructive defect with air trapping and reduced DLCO consistent with emphysema. Significant bronchodilator response present.  Labs: CBC No results found for: "WBC", "RBC", "HGB", "HCT", "PLT", "MCV", "MCH", "MCHC", "RDW", "LYMPHSABS", "MONOABS", "EOSABS", "BASOSABS"  Imaging, labs and test noted above have been reviewed independently by me.  Assessment & Plan:   Discussion: 67 year old male active smoker who presents for COPD follow-up. Lost to follow-up for >2 years and previously noted to be intermittently adherent to therapy. Not controlled and not on any maintenance inhalers. Continues to smoke 1 ppd. Discussed clinical course and management of COPD including bronchodilator regimen, preventive care including vaccinations and action plan for exacerbation.  Severe COPD --START Breztri TWO puffs in the morning  and evening. Rinse mouth out after use --CONTINUE Albuterol AS NEEDED for shortness of breath or wheezing. OK to use prior to exercise --REFER to pulmonary rehab --Encouraged regular aerobic exercise five days a week for 30 min  Tobacco abuse Patient is an active smoker. We discussed smoking cessation for 5 minutes. We discussed triggers and stressors and ways to deal with them. We discussed barriers to continued smoking and benefits of smoking cessation. Provided patient with information cessation techniques and interventions including [place] quitline.   Health Maintenance Immunization History  Administered Date(s) Administered   COVID-19, mRNA, vaccine(Comirnaty)12 years and older 04/17/2022   Influenza,inj,Quad PF,6+ Mos 12/28/2019   PFIZER(Purple Top)SARS-COV-2 Vaccination 07/15/2019, 08/04/2019, 04/23/2020   Pfizer Covid-19 Vaccine Bivalent Booster 18yrs & up 02/12/2021   CT Lung Screen - We discussed risks and benefits of screen. Patient declined for now and will like to revisit at next   Orders Placed This Encounter  Procedures   AMB referral to pulmonary rehabilitation     Referral Priority:   Routine    Referral Type:   Consultation    Number of Visits Requested:   1   Meds ordered this encounter  Medications   Budeson-Glycopyrrol-Formoterol (BREZTRI AEROSPHERE) 160-9-4.8 MCG/ACT AERO    Sig: Inhale 2 puffs into the lungs in the morning and at bedtime.    Dispense:  10.7 g    Refill:  5   Return in about 3 months (around 03/26/2023).  I have spent a total time of 45-minutes on the day of the appointment including chart review, data review, collecting history, coordinating care and discussing medical diagnosis and plan with the patient/family. Past medical history, allergies, medications were reviewed. Pertinent imaging, labs and tests included in this note have been reviewed and interpreted independently by me.  Chaquana Nichols Mechele Collin, MD [facility] 12/24/2022 4:02 PM  Office Number [phone]

## 2022-12-25 ENCOUNTER — Other Ambulatory Visit (HOSPITAL_COMMUNITY): Payer: Self-pay

## 2022-12-26 DIAGNOSIS — I10 Essential (primary) hypertension: Secondary | ICD-10-CM | POA: Diagnosis not present

## 2022-12-26 DIAGNOSIS — J449 Chronic obstructive pulmonary disease, unspecified: Secondary | ICD-10-CM | POA: Diagnosis not present

## 2022-12-26 DIAGNOSIS — Z0000 Encounter for general adult medical examination without abnormal findings: Secondary | ICD-10-CM | POA: Diagnosis not present

## 2022-12-26 DIAGNOSIS — R82998 Other abnormal findings in urine: Secondary | ICD-10-CM | POA: Diagnosis not present

## 2023-01-01 ENCOUNTER — Encounter (HOSPITAL_COMMUNITY): Payer: Self-pay

## 2023-01-02 ENCOUNTER — Telehealth (HOSPITAL_COMMUNITY): Payer: Self-pay

## 2023-01-02 NOTE — Telephone Encounter (Signed)
Called to confirm appt. Pt confirmed appt. Instructed pt on proper footwear. Gave directions along with department number.

## 2023-01-05 ENCOUNTER — Encounter (HOSPITAL_COMMUNITY)
Admission: RE | Admit: 2023-01-05 | Discharge: 2023-01-05 | Disposition: A | Discharge status: Home / Self Care | Payer: Medicare Other | Source: Ambulatory Visit | Attending: Pulmonary Disease | Admitting: Pulmonary Disease

## 2023-01-05 ENCOUNTER — Encounter (HOSPITAL_COMMUNITY): Payer: Self-pay

## 2023-01-05 VITALS — BP 118/70 | HR 90 | Wt 184.7 lb

## 2023-01-05 DIAGNOSIS — J449 Chronic obstructive pulmonary disease, unspecified: Secondary | ICD-10-CM | POA: Insufficient documentation

## 2023-01-05 DIAGNOSIS — Z5189 Encounter for other specified aftercare: Secondary | ICD-10-CM | POA: Diagnosis not present

## 2023-01-05 NOTE — Progress Notes (Signed)
Pulmonary Rehab Orientation Physical Assessment Note  Physical assessment reveals  Pt is alert and oriented x 3.  Heart rate is normal, Lung assessment completed by Durel Salts RT. Reports mostly non-productive cough however he sometimes after he eats has sinus drainage. Has not pinpointed a particular food group that he notices this with.  Also has sneezing after eating ketchup but not always. Bowel sounds present.  Pt denies abdominal discomfort, nausea, vomiting or diarrhea. Grip strength equal, strong. Distal pulses palpable; no swelling to lower extremities. Looking forward to pulmonary rehab. Alanson Aly, BSN Cardiac and Emergency planning/management officer

## 2023-01-05 NOTE — Progress Notes (Signed)
[facility] 67 y.o. male  Initial Psychosocial Assessment  Pt psychosocial assessment reveals pt lives with their spouse. Pt is currently retired. 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 Pt reports his  stress level is low. Areas of stress/anxiety include health.  Pt does not exhibit signs of depression.  Pt shows good  coping skills with positive outlook . Offered emotional support and reassurance. Monitor and evaluate progress toward psychosocial goal(s).  Goal(s): Improved management of stress Improved coping skills Help patient work toward returning to meaningful activities that improve patient's QOL and are attainable with patient's lung disease   01/05/2023 11:59 AM

## 2023-01-05 NOTE — Progress Notes (Signed)
Pulmonary Individual Treatment Plan  Patient Details  Name: Brad Massey MRN: [id number] Date of Birth: 1955-10-13 Referring Provider:   Doristine Devoid Pulmonary Rehab Walk Test from 01/05/2023 in [facility]  Referring Provider [name]       Initial Encounter Date:  Flowsheet Row Pulmonary Rehab Walk Test from 01/05/2023 in [facility]  Date 01/05/23       Visit Diagnosis: Stage 3 severe COPD by GOLD classification (HCC)  Patient's Home Medications on Admission:   Current Outpatient Medications:    albuterol (VENTOLIN HFA) 108 (90 Base) MCG/ACT inhaler, Inhale into the lungs every 6 (six) hours as needed for wheezing or shortness of breath., Disp: , Rfl:    aspirin EC 81 MG tablet, Take 1 tablet (81 mg total) by mouth daily. Swallow whole., Disp: 90 tablet, Rfl: 3   Budeson-Glycopyrrol-Formoterol (BREZTRI AEROSPHERE) 160-9-4.8 MCG/ACT AERO, Inhale 2 puffs into the lungs in the morning and at bedtime., Disp: 10.7 g, Rfl: 5   rosuvastatin (CRESTOR) 40 MG tablet, Take 40 mg by mouth daily., Disp: , Rfl:    verapamil (CALAN) 120 MG tablet, Take 120 mg by mouth 3 (three) times daily., Disp: , Rfl:    nitroGLYCERIN (NITROSTAT) 0.4 MG SL tablet, Place 1 tablet (0.4 mg total) under the tongue every 5 (five) minutes as needed for chest pain., Disp: 25 tablet, Rfl: 3  Current Facility-Administered Medications:    0.9 %  sodium chloride infusion, 500 mL, Intravenous, Continuous, Armbruster, Willaim Rayas, MD  Past Medical History: Past Medical History:  Diagnosis Date   Cluster headache    COPD (chronic obstructive pulmonary disease) (HCC)    Coronary artery disease    DVT (deep venous thrombosis) (HCC)    Heart murmur    Hypertension     Tobacco Use: Social History   Tobacco Use  Smoking Status Every Day   Current packs/day: 1.00   Average packs/day: 1 pack/day for 46.5  years (46.5 ttl pk-yrs)   Types: Cigarettes   Start date: 06/25/1976  Smokeless Tobacco Never  Tobacco Comments   smokes 1 ppd    Labs: Review Flowsheet       Latest Ref Rng & Units 04/12/2020  Labs for ITP Cardiac and Pulmonary Rehab  Cholestrol 100 - 199 mg/dL 213   LDL (calc) 0 - 99 mg/dL 57   HDL-C >08 mg/dL 46   Trlycerides 0 - 657 mg/dL 66     Details            Capillary Blood Glucose: No results found for: "GLUCAP"   Pulmonary Assessment Scores:  Pulmonary Assessment Scores     Row Name 01/05/23 1153         ADL UCSD   ADL Phase Entry     SOB Score total 18       CAT Score   CAT Score 12       mMRC Score   mMRC Score 2             UCSD: Self-administered rating of dyspnea associated with activities of daily living (ADLs) 6-point scale (0 = "not at all" to 5 = "maximal or unable to do because of breathlessness")  Scoring Scores range from 0 to 120.  Minimally important difference is 5 units  CAT: CAT can identify the health impairment of COPD patients and is better correlated with disease progression.  CAT has a  scoring range of zero to 40. The CAT score is classified into four groups of low (less than 10), medium (10 - 20), high (21-30) and very high (31-40) based on the impact level of disease on health status. A CAT score over 10 suggests significant symptoms.  A worsening CAT score could be explained by an exacerbation, poor medication adherence, poor inhaler technique, or progression of COPD or comorbid conditions.  CAT MCID is 2 points  mMRC: mMRC (Modified Medical Research Council) Dyspnea Scale is used to assess the degree of baseline functional disability in patients of respiratory disease due to dyspnea. No minimal important difference is established. A decrease in score of 1 point or greater is considered a positive change.   Pulmonary Function Assessment:  Pulmonary Function Assessment - 01/05/23 1121       Breath   Bilateral  Breath Sounds Clear    Shortness of Breath Yes;Limiting activity             Exercise Target Goals: Exercise Program Goal: Individual exercise prescription set using results from initial 6 min walk test and THRR while considering  patient's activity barriers and safety.   Exercise Prescription Goal: Initial exercise prescription builds to 30-45 minutes a day of aerobic activity, 2-3 days per week.  Home exercise guidelines will be given to patient during program as part of exercise prescription that the participant will acknowledge.  Activity Barriers & Risk Stratification:  Activity Barriers & Cardiac Risk Stratification - 01/05/23 1117       Activity Barriers & Cardiac Risk Stratification   Activity Barriers Deconditioning;Muscular Weakness;Shortness of Breath    Cardiac Risk Stratification Low             6 Minute Walk:  6 Minute Walk     Row Name 01/05/23 1225         6 Minute Walk   Phase Initial     Distance 1382 feet     Walk Time 6 minutes     # of Rest Breaks 0     MPH 2.62     METS 3.31     RPE 8     Perceived Dyspnea  0     VO2 Peak 11.57     Symptoms No     Resting HR 64 bpm     Resting BP 118/70     Resting Oxygen Saturation  95 %     Exercise Oxygen Saturation  during 6 min walk 94 %     Max Ex. HR 88 bpm     Max Ex. BP 120/70     2 Minute Post BP 112/70       Interval HR   1 Minute HR 85     2 Minute HR 88     3 Minute HR 80     4 Minute HR 82     5 Minute HR 79     6 Minute HR 79     2 Minute Post HR 64     Interval Heart Rate? Yes       Interval Oxygen   Interval Oxygen? Yes     Baseline Oxygen Saturation % 95 %     1 Minute Oxygen Saturation % 96 %     1 Minute Liters of Oxygen 0 L     2 Minute Oxygen Saturation % 91 %     2 Minute Liters of Oxygen 0 L     3 Minute Oxygen Saturation %  95 %     3 Minute Liters of Oxygen 0 L     4 Minute Oxygen Saturation % 96 %     4 Minute Liters of Oxygen 0 L     5 Minute Oxygen  Saturation % 95 %     5 Minute Liters of Oxygen 0 L     6 Minute Oxygen Saturation % 96 %     6 Minute Liters of Oxygen 0 L     2 Minute Post Oxygen Saturation % 96 %     2 Minute Post Liters of Oxygen 0 L              Oxygen Initial Assessment:  Oxygen Initial Assessment - 01/05/23 1119       Home Oxygen   Home Oxygen Device None    Sleep Oxygen Prescription None    Home Exercise Oxygen Prescription None    Home Resting Oxygen Prescription None      Initial 6 min Walk   Oxygen Used None      Program Oxygen Prescription   Program Oxygen Prescription None      Intervention   Short Term Goals To learn and understand importance of maintaining oxygen saturations>88%;To learn and demonstrate proper use of respiratory medications;To learn and understand importance of monitoring SPO2 with pulse oximeter and demonstrate accurate use of the pulse oximeter.;To learn and demonstrate proper pursed lip breathing techniques or other breathing techniques.     Long  Term Goals Maintenance of O2 saturations>88%;Compliance with respiratory medication;Verbalizes importance of monitoring SPO2 with pulse oximeter and return demonstration;Exhibits proper breathing techniques, such as pursed lip breathing or other method taught during program session;Demonstrates proper use of MDI's             Oxygen Re-Evaluation:   Oxygen Discharge (Final Oxygen Re-Evaluation):   Initial Exercise Prescription:  Initial Exercise Prescription - 01/05/23 1200       Date of Initial Exercise RX and Referring Provider   Date 01/05/23    Referring Provider [name]    Expected Discharge Date 04/02/23      Treadmill   MPH 2.5    Grade 0    Minutes 15      Elliptical   Level 1    Speed 1    Minutes 15      Prescription Details   Frequency (times per week) 2    Duration Progress to 30 minutes of continuous aerobic without signs/symptoms of physical distress      Intensity   THRR 40-80% of Max  Heartrate 62-124    Ratings of Perceived Exertion 11-13    Perceived Dyspnea 0-4      Progression   Progression Continue to progress workloads to maintain intensity without signs/symptoms of physical distress.      Resistance Training   Training Prescription Yes    Weight blue bands    Reps 10-15             Perform Capillary Blood Glucose checks as needed.  Exercise Prescription Changes:   Exercise Comments:   Exercise Goals and Review:   Exercise Goals     Row Name 01/05/23 1117             Exercise Goals   Increase Physical Activity Yes       Intervention Provide advice, education, support and counseling about physical activity/exercise needs.;Develop an individualized exercise prescription for aerobic and resistive training based on initial evaluation findings, risk stratification, comorbidities  and participant's personal goals.       Expected Outcomes Short Term: Attend rehab on a regular basis to increase amount of physical activity.;Long Term: Exercising regularly at least 3-5 days a week.;Long Term: Add in home exercise to make exercise part of routine and to increase amount of physical activity.       Increase Strength and Stamina Yes       Intervention Provide advice, education, support and counseling about physical activity/exercise needs.;Develop an individualized exercise prescription for aerobic and resistive training based on initial evaluation findings, risk stratification, comorbidities and participant's personal goals.       Expected Outcomes Short Term: Increase workloads from initial exercise prescription for resistance, speed, and METs.;Short Term: Perform resistance training exercises routinely during rehab and add in resistance training at home;Long Term: Improve cardiorespiratory fitness, muscular endurance and strength as measured by increased METs and functional capacity ( )       Able to understand and use rate of perceived exertion (RPE) scale  Yes       Intervention Provide education and explanation on how to use RPE scale       Expected Outcomes Short Term: Able to use RPE daily in rehab to express subjective intensity level;Long Term:  Able to use RPE to guide intensity level when exercising independently       Able to understand and use Dyspnea scale Yes       Intervention Provide education and explanation on how to use Dyspnea scale       Expected Outcomes Short Term: Able to use Dyspnea scale daily in rehab to express subjective sense of shortness of breath during exertion;Long Term: Able to use Dyspnea scale to guide intensity level when exercising independently       Knowledge and understanding of Target Heart Rate Range (THRR) Yes       Intervention Provide education and explanation of THRR including how the numbers were predicted and where they are located for reference       Expected Outcomes Short Term: Able to state/look up THRR;Long Term: Able to use THRR to govern intensity when exercising independently;Short Term: Able to use daily as guideline for intensity in rehab       Understanding of Exercise Prescription Yes       Intervention Provide education, explanation, and written materials on patient's individual exercise prescription       Expected Outcomes Short Term: Able to explain program exercise prescription;Long Term: Able to explain home exercise prescription to exercise independently                Exercise Goals Re-Evaluation :   Discharge Exercise Prescription (Final Exercise Prescription Changes):   Nutrition:  Target Goals: Understanding of nutrition guidelines, daily intake of sodium 1500mg , cholesterol 200mg , calories 30% from fat and 7% or less from saturated fats, daily to have 5 or more servings of fruits and vegetables.  Biometrics:  Pre Biometrics - 01/05/23 1258       Pre Biometrics   Grip Strength 40 kg              Nutrition Therapy Plan and Nutrition Goals:   Nutrition  Assessments:  MEDIFICTS Score Key: ?70 Need to make dietary changes  40-70 Heart Healthy Diet ? 40 Therapeutic Level Cholesterol Diet   Picture Your Plate Scores: <16 Unhealthy dietary pattern with much room for improvement. 41-50 Dietary pattern unlikely to meet recommendations for good health and room for improvement. 51-60 More healthful dietary pattern,  with some room for improvement.  >60 Healthy dietary pattern, although there may be some specific behaviors that could be improved.    Nutrition Goals Re-Evaluation:   Nutrition Goals Discharge (Final Nutrition Goals Re-Evaluation):   Psychosocial: Target Goals: Acknowledge presence or absence of significant depression and/or stress, maximize coping skills, provide positive support system. Participant is able to verbalize types and ability to use techniques and skills needed for reducing stress and depression.  Initial Review & Psychosocial Screening:  Initial Psych Review & Screening - 01/05/23 1112       Initial Review   Current issues with None Identified      Family Dynamics   Good Support System? Yes      Barriers   Psychosocial barriers to participate in program There are no identifiable barriers or psychosocial needs.      Screening Interventions   Interventions Encouraged to exercise             Quality of Life Scores:  Scores of 19 and below usually indicate a poorer quality of life in these areas.  A difference of  2-3 points is a clinically meaningful difference.  A difference of 2-3 points in the total score of the Quality of Life Index has been associated with significant improvement in overall quality of life, self-image, physical symptoms, and general health in studies assessing change in quality of life.  PHQ-9: Review Flowsheet       01/05/2023  Depression screen PHQ 2/9  Decreased Interest 0  Down, Depressed, Hopeless 0  PHQ - 2 Score 0  Altered sleeping 1  Tired, decreased energy 1   Change in appetite 0  Feeling bad or failure about yourself  0  Trouble concentrating 0  Moving slowly or fidgety/restless 0  Suicidal thoughts 0  PHQ-9 Score 2  Difficult doing work/chores Not difficult at all    Details           Interpretation of Total Score  Total Score Depression Severity:  1-4 = Minimal depression, 5-9 = Mild depression, 10-14 = Moderate depression, 15-19 = Moderately severe depression, 20-27 = Severe depression   Psychosocial Evaluation and Intervention:  Psychosocial Evaluation - 01/05/23 1113       Psychosocial Evaluation & Interventions   Interventions Encouraged to exercise with the program and follow exercise prescription    Comments Yeudiel denies any psychosocial barriers or concerns at this time.    Expected Outcomes For Alfonso to participate in PR free of any psychosocial barriers or concerns    Continue Psychosocial Services  No Follow up required             Psychosocial Re-Evaluation:   Psychosocial Discharge (Final Psychosocial Re-Evaluation):   Education: Education Goals: Education classes will be provided on a weekly basis, covering required topics. Participant will state understanding/return demonstration of topics presented.  Learning Barriers/Preferences:  Learning Barriers/Preferences - 01/05/23 1114       Learning Barriers/Preferences   Learning Barriers Sight   wears glasses, needs cataract surgery   Learning Preferences Group Instruction;Individual Instruction;Skilled Demonstration             Education Topics: Introduction to Pulmonary Rehab Group instruction provided by PowerPoint, verbal discussion, and written material to support subject matter. Instructor reviews what Pulmonary Rehab is, the purpose of the program, and how patients are referred.     Know Your Numbers Group instruction that is supported by a PowerPoint presentation. Instructor discusses importance of knowing and understanding resting,  exercise,  and post-exercise oxygen saturation, heart rate, and blood pressure. Oxygen saturation, heart rate, blood pressure, rating of perceived exertion, and dyspnea are reviewed along with a normal range for these values.    Exercise for the Pulmonary Patient Group instruction that is supported by a PowerPoint presentation. Instructor discusses benefits of exercise, core components of exercise, frequency, duration, and intensity of an exercise routine, importance of utilizing pulse oximetry during exercise, safety while exercising, and options of places to exercise outside of rehab.       MET Level  Group instruction provided by PowerPoint, verbal discussion, and written material to support subject matter. Instructor reviews what METs are and how to increase METs.    Pulmonary Medications Verbally interactive group education provided by instructor with focus on inhaled medications and proper administration.   Anatomy and Physiology of the Respiratory System Group instruction provided by PowerPoint, verbal discussion, and written material to support subject matter. Instructor reviews respiratory cycle and anatomical components of the respiratory system and their functions. Instructor also reviews differences in obstructive and restrictive respiratory diseases with examples of each.    Oxygen Safety Group instruction provided by PowerPoint, verbal discussion, and written material to support subject matter. There is an overview of "What is Oxygen" and "Why do we need it".  Instructor also reviews how to create a safe environment for oxygen use, the importance of using oxygen as prescribed, and the risks of noncompliance. There is a brief discussion on traveling with oxygen and resources the patient may utilize.   Oxygen Use Group instruction provided by PowerPoint, verbal discussion, and written material to discuss how supplemental oxygen is prescribed and different types of oxygen supply  systems. Resources for more information are provided.    Breathing Techniques Group instruction that is supported by demonstration and informational handouts. Instructor discusses the benefits of pursed lip and diaphragmatic breathing and detailed demonstration on how to perform both.     Risk Factor Reduction Group instruction that is supported by a PowerPoint presentation. Instructor discusses the definition of a risk factor, different risk factors for pulmonary disease, and how the heart and lungs work together.   MD Day A group question and answer session with a medical doctor that allows participants to ask questions that relate to their pulmonary disease state.   Nutrition for the Pulmonary Patient Group instruction provided by PowerPoint slides, verbal discussion, and written materials to support subject matter. The instructor gives an explanation and review of healthy diet recommendations, which includes a discussion on weight management, recommendations for fruit and vegetable consumption, as well as protein, fluid, caffeine, fiber, sodium, sugar, and alcohol. Tips for eating when patients are short of breath are discussed.    Other Education Group or individual verbal, written, or video instructions that support the educational goals of the pulmonary rehab program.    Knowledge Questionnaire Score:  Knowledge Questionnaire Score - 01/05/23 1153       Knowledge Questionnaire Score   Pre Score 14/18             Core Components/Risk Factors/Patient Goals at Admission:  Personal Goals and Risk Factors at Admission - 01/05/23 1115       Core Components/Risk Factors/Patient Goals on Admission    Weight Management Weight Loss;Yes    Intervention Weight Management: Develop a combined nutrition and exercise program designed to reach desired caloric intake, while maintaining appropriate intake of nutrient and fiber, sodium and fats, and appropriate energy expenditure  required for the weight goal.;Weight  Management: Provide education and appropriate resources to help participant work on and attain dietary goals.;Weight Management/Obesity: Establish reasonable short term and long term weight goals.;Obesity: Provide education and appropriate resources to help participant work on and attain dietary goals.    Expected Outcomes Short Term: Continue to assess and modify interventions until short term weight is achieved;Long Term: Adherence to nutrition and physical activity/exercise program aimed toward attainment of established weight goal;Weight Loss: Understanding of general recommendations for a balanced deficit meal plan, which promotes 1-2 lb weight loss per week and includes a negative energy balance of [phone] kcal/d;Understanding recommendations for meals to include 15-35% energy as protein, 25-35% energy from fat, 35-60% energy from carbohydrates, less than 200mg  of dietary cholesterol, 20-35 gm of total fiber daily;Understanding of distribution of calorie intake throughout the day with the consumption of 4-5 meals/snacks    Tobacco Cessation Yes    Number of packs per day 1ppd    Intervention Assist the participant in steps to quit. Provide individualized education and counseling about committing to Tobacco Cessation, relapse prevention, and pharmacological support that can be provided by physician.;Education officer, environmental, assist with locating and accessing local/national Quit Smoking programs, and support quit date choice.    Expected Outcomes Short Term: Will demonstrate readiness to quit, by selecting a quit date.;Short Term: Will quit all tobacco product use, adhering to prevention of relapse plan.;Long Term: Complete abstinence from all tobacco products for at least 12 months from quit date.    Improve shortness of breath with ADL's Yes    Intervention Provide education, individualized exercise plan and daily activity instruction to help decrease symptoms  of SOB with activities of daily living.    Expected Outcomes Short Term: Improve cardiorespiratory fitness to achieve a reduction of symptoms when performing ADLs;Long Term: Be able to perform more ADLs without symptoms or delay the onset of symptoms    Increase knowledge of respiratory medications and ability to use respiratory devices properly  Yes    Intervention Provide education and demonstration as needed of appropriate use of medications, inhalers, and oxygen therapy.    Expected Outcomes Short Term: Achieves understanding of medications use. Understands that oxygen is a medication prescribed by physician. Demonstrates appropriate use of inhaler and oxygen therapy.;Long Term: Maintain appropriate use of medications, inhalers, and oxygen therapy.             Core Components/Risk Factors/Patient Goals Review:    Core Components/Risk Factors/Patient Goals at Discharge (Final Review):    ITP Comments:   Comments: Dr. Mechele Collin is Medical Director for Pulmonary Rehab at [facility].

## 2023-01-05 NOTE — Progress Notes (Signed)
[facility] 67 y.o. male Pulmonary Rehab Orientation Note This patient who was referred to Pulmonary Rehab by Dr. [name] with the diagnosis of COPD 3 arrived today in Cardiac and Pulmonary Rehab. He arrived ambulatory with normal gait. He does not carry portable oxygen.  Per patient, Jacquan uses oxygen never. Color good, skin warm and dry. Patient is oriented to time and place. Patient's medical history, psychosocial health, and medications reviewed. Psychosocial assessment reveals patient lives with spouse. Tavist is currently retired. 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 Patient reports his stress level is low. Areas of stress/anxiety include health. Patient does not exhibit signs of depression. PHQ2/9 score 0/2. Benjamyn shows good  coping skills with positive outlook on life. Offered emotional support and reassurance. Will continue to monitor. Physical assessment performed by Nurse pick: Karlene Lineman RN. Please see their orientation physical assessment note. Jemarcus reports he  does take medications as prescribed. Patient states he  follows a regular  diet. The patient reports no specific efforts to gain or lose weight.. Patient's weight will be monitored closely. Demonstration and practice of PLB using pulse oximeter. Christianjames able to return demonstration satisfactorily. Safety and hand hygiene in the exercise area reviewed with patient. Braycen voices understanding of the information reviewed. Department expectations discussed with patient and achievable goals were set. The patient shows enthusiasm about attending the program and we look forward to working with [employer] Loye completed a 6 min walk test today and is scheduled to begin exercise on 9/17@ 1:15.   1030-1210 Guss Bunde, BSRT

## 2023-01-07 DIAGNOSIS — G44009 Cluster headache syndrome, unspecified, not intractable: Secondary | ICD-10-CM | POA: Diagnosis not present

## 2023-01-07 DIAGNOSIS — G518 Other disorders of facial nerve: Secondary | ICD-10-CM | POA: Diagnosis not present

## 2023-01-07 DIAGNOSIS — M791 Myalgia, unspecified site: Secondary | ICD-10-CM | POA: Diagnosis not present

## 2023-01-07 DIAGNOSIS — M542 Cervicalgia: Secondary | ICD-10-CM | POA: Diagnosis not present

## 2023-01-13 ENCOUNTER — Encounter (HOSPITAL_COMMUNITY)
Admission: RE | Admit: 2023-01-13 | Discharge: 2023-01-13 | Disposition: A | Discharge status: Home / Self Care | Payer: Medicare Other | Source: Ambulatory Visit | Attending: Pulmonary Disease | Admitting: Pulmonary Disease

## 2023-01-13 VITALS — Wt 185.8 lb

## 2023-01-13 DIAGNOSIS — J449 Chronic obstructive pulmonary disease, unspecified: Secondary | ICD-10-CM | POA: Diagnosis not present

## 2023-01-13 DIAGNOSIS — Z5189 Encounter for other specified aftercare: Secondary | ICD-10-CM | POA: Diagnosis not present

## 2023-01-13 NOTE — Progress Notes (Signed)
Daily Session Note  Patient Details  Name: Brad Massey MRN: [id number] Date of Birth: 1955-05-20 Referring Provider:   Doristine Devoid Pulmonary Rehab Walk Test from 01/05/2023 in [facility], [facility]  Referring Provider [name]       Encounter Date: 01/13/2023  Check In:  Session Check In - 01/13/23 1405       Check-In   Supervising physician immediately available to respond to emergencies [facility] MD immediately available    Physician(s) Jari Favre, PA    Location [facility]    Staff Present Essie Hart, RN, BSN;Chelsea Pedretti Katrinka Blazing, Zella Richer, MS, ACSM-CEP, Exercise Physiologist;Randi Idelle Crouch BS, ACSM-CEP, Exercise Physiologist    Virtual Visit No    Medication changes reported     No    Fall or balance concerns reported    No    Tobacco Cessation No Change    Warm-up and Cool-down Not performed (comment)    Resistance Training Performed No    VAD Patient? No      Pain Assessment   Currently in Pain? No/denies    Multiple Pain Sites No             Capillary Blood Glucose: No results found for this or any previous visit (from the past 24 hour(s)).   Exercise Prescription Changes - 01/13/23 1500       Response to Exercise   Blood Pressure (Admit) 140/72    Blood Pressure (Exercise) 154/80    Blood Pressure (Exit) 118/72    Heart Rate (Admit) 83 bpm    Heart Rate (Exercise) 107 bpm    Heart Rate (Exit) 85 bpm    Oxygen Saturation (Admit) 95 %    Oxygen Saturation (Exercise) 93 %    Oxygen Saturation (Exit) 95 %    Rating of Perceived Exertion (Exercise) 11    Perceived Dyspnea (Exercise) 1    Duration Progress to 30 minutes of  aerobic without signs/symptoms of physical distress    Intensity THRR unchanged      Progression   Progression Continue to progress workloads to maintain intensity without signs/symptoms of physical distress.      Resistance Training   Training Prescription Yes    Weight blue  bands    Reps 10-15    Time 10 Minutes      Treadmill   MPH 2    Grade 0    Minutes 15    METs 2.53      Elliptical   Level 1    Speed 1    Minutes 15    METs 4             Social History   Tobacco Use  Smoking Status Every Day   Current packs/day: 1.00   Average packs/day: 1 pack/day for 46.6 years (46.6 ttl pk-yrs)   Types: Cigarettes   Start date: 06/25/1976  Smokeless Tobacco Never  Tobacco Comments   smokes 1 ppd    Goals Met:  Proper associated with RPD/PD & O2 Sat Independence with exercise equipment Exercise tolerated well No report of concerns or symptoms today Strength training completed today  Goals Unmet:  Not Applicable  Comments: Service time is from 1316 to 1441.    Dr. Mechele Collin is Medical Director for Pulmonary Rehab at [facility].

## 2023-01-14 DIAGNOSIS — K080 Exfoliation of teeth due to systemic causes: Secondary | ICD-10-CM | POA: Diagnosis not present

## 2023-01-15 ENCOUNTER — Encounter (HOSPITAL_COMMUNITY)
Admission: RE | Admit: 2023-01-15 | Discharge: 2023-01-15 | Disposition: A | Discharge status: Home / Self Care | Payer: Medicare Other | Source: Ambulatory Visit | Attending: Pulmonary Disease | Admitting: Pulmonary Disease

## 2023-01-15 DIAGNOSIS — J449 Chronic obstructive pulmonary disease, unspecified: Secondary | ICD-10-CM | POA: Diagnosis not present

## 2023-01-15 DIAGNOSIS — Z5189 Encounter for other specified aftercare: Secondary | ICD-10-CM | POA: Diagnosis not present

## 2023-01-15 NOTE — Progress Notes (Signed)
Daily Session Note  Patient Details  Name: Brad Massey MRN: [id number] Date of Birth: [birth year] Referring Provider:   Doristine Devoid Pulmonary Rehab Walk Test from 01/05/2023 in [facility], V[facility], & [facility]  Referring Provider [name]       Encounter Date: 01/15/2023  Check In:  Session Check In - 01/15/23 1522       Check-In   Supervising physician immediately available to respond to emergencies [facility] immediately available    Physician(s) Jari Favre, PA    Location [facility]    Staff Present Essie Hart, RN, BSN;Tashawn Greff Katrinka Blazing, Zella Richer, MS, ACSM-CEP, Exercise Physiologist;Randi Idelle Crouch BS, ACSM-CEP, Exercise Physiologist    Virtual Visit No    Medication changes reported     No    Fall or balance concerns reported    No    Tobacco Cessation No Change    Warm-up and Cool-down Performed as group-led instruction    Resistance Training Performed Yes    VAD Patient? No    PAD/SET Patient? No      Pain Assessment   Currently in Pain? No/denies    Multiple Pain Sites No             Capillary Blood Glucose: No results found for this or any previous visit (from the past 24 hour(s)).    Social History   Tobacco Use  Smoking Status Every Day   Current packs/day: 1.00   Average packs/day: 1 pack/day for 46.6 years (46.6 ttl pk-yrs)   Types: Cigarettes   Start date: 06/25/1976  Smokeless Tobacco Never  Tobacco Comments   smokes 1 ppd    Goals Met:  Proper associated with RPD/PD & O2 Sat Independence with exercise equipment Exercise tolerated well No report of concerns or symptoms today Strength training completed today  Goals Unmet:  Not Applicable  Comments: Service time is from 1317 to 1449.      Dr. Mechele Collin is Medical Director for Pulmonary Rehab at [facility].

## 2023-01-19 ENCOUNTER — Inpatient Hospital Stay (HOSPITAL_BASED_OUTPATIENT_CLINIC_OR_DEPARTMENT_OTHER): Payer: BC Managed Care – PPO | Admitting: Pulmonary Disease

## 2023-01-20 ENCOUNTER — Encounter (HOSPITAL_COMMUNITY): Payer: Medicare Other

## 2023-01-22 ENCOUNTER — Encounter (HOSPITAL_COMMUNITY)
Admission: RE | Admit: 2023-01-22 | Discharge: 2023-01-22 | Disposition: A | Discharge status: Home / Self Care | Payer: Medicare Other | Source: Ambulatory Visit | Attending: Pulmonary Disease | Admitting: Pulmonary Disease

## 2023-01-22 DIAGNOSIS — J449 Chronic obstructive pulmonary disease, unspecified: Secondary | ICD-10-CM

## 2023-01-22 DIAGNOSIS — Z5189 Encounter for other specified aftercare: Secondary | ICD-10-CM | POA: Diagnosis not present

## 2023-01-22 NOTE — Progress Notes (Signed)
Daily Session Note  Patient Details  Name: Brad Massey MRN: [id number] Date of Birth: 1955-08-06 Referring Provider:   Doristine Devoid Pulmonary Rehab Walk Test from 01/05/2023 in [facility] for [facility], Vascular, & Lung Health  Referring Provider [name]       Encounter Date: 01/22/2023  Check In:  Session Check In - 01/22/23 1326       Check-In   Supervising physician immediately available to respond to emergencies [facility] MD immediately available    Physician(s) Edd Fabian, NP    Location [facility]    Staff Present Essie Hart, RN, BSN;Emir Nack Katrinka Blazing, Zella Richer, MS, ACSM-CEP, Exercise Physiologist;Randi Idelle Crouch BS, ACSM-CEP, Exercise Physiologist    Virtual Visit No    Medication changes reported     No    Fall or balance concerns reported    No    Tobacco Cessation No Change    Warm-up and Cool-down Performed as group-led instruction    Resistance Training Performed Yes    VAD Patient? No    PAD/SET Patient? No      Pain Assessment   Currently in Pain? No/denies    Multiple Pain Sites No             Capillary Blood Glucose: No results found for this or any previous visit (from the past 24 hour(s)).    Social History   Tobacco Use  Smoking Status Every Day   Current packs/day: 1.00   Average packs/day: 1 pack/day for 46.6 years (46.6 ttl pk-yrs)   Types: Cigarettes   Start date: 06/25/1976  Smokeless Tobacco Never  Tobacco Comments   smokes 1 ppd    Goals Met:  Proper associated with RPD/PD & O2 Sat Independence with exercise equipment Exercise tolerated well No report of concerns or symptoms today Strength training completed today  Goals Unmet:  Not Applicable  Comments: Service time is from 1317 to 1450.    Dr. Mechele Collin is Medical Director for Pulmonary Rehab at [facility].

## 2023-01-22 NOTE — Progress Notes (Addendum)
Discussed with pt readiness for smoking cessation. He currently smokes 1 ppd. His wife does not smoke. He sts that he thinks about quitting daily but he has never actually never taken action steps to quit. Gave pt resources to view and consider the effects of smoking. Encouraged pt to consider action in quitting. He agreed. Will encourage pt to set a quit date. Ethelda Chick BS, ACSM-CEP 01/22/2023 3:44 PM

## 2023-01-26 DIAGNOSIS — D3131 Benign neoplasm of right choroid: Secondary | ICD-10-CM | POA: Diagnosis not present

## 2023-01-27 ENCOUNTER — Encounter (HOSPITAL_COMMUNITY)
Admission: RE | Admit: 2023-01-27 | Discharge: 2023-01-27 | Disposition: A | Discharge status: Home / Self Care | Payer: Medicare Other | Source: Ambulatory Visit | Attending: Pulmonary Disease | Admitting: Pulmonary Disease

## 2023-01-27 VITALS — Wt 188.9 lb

## 2023-01-27 DIAGNOSIS — J449 Chronic obstructive pulmonary disease, unspecified: Secondary | ICD-10-CM | POA: Insufficient documentation

## 2023-01-27 DIAGNOSIS — F17210 Nicotine dependence, cigarettes, uncomplicated: Secondary | ICD-10-CM | POA: Diagnosis not present

## 2023-01-27 NOTE — Progress Notes (Signed)
Daily Session Note  Patient Details  Name: Brad Massey MRN: [id number] Date of Birth: Jun 30, 1955 Referring Provider:   Doristine Devoid Pulmonary Rehab Walk Test from 01/05/2023 in [facility] for [facility], Vascular, & Lung Health  Referring Provider [name]       Encounter Date: 01/27/2023  Check In:  Session Check In - 01/27/23 1332       Check-In   Supervising physician immediately available to respond to emergencies [facility] MD immediately available    Physician(s) Brad Searing, NP    Location [facility]    Staff Present Essie Hart, RN, BSN;Casey Katrinka Blazing, Zella Richer, MS, ACSM-CEP, Exercise Physiologist;Subhan Hoopes Dionisio Paschal, ACSM-CEP, Exercise Physiologist;Samantha Belarus, [place], LDN    Virtual Visit No    Medication changes reported     No    Fall or balance concerns reported    No    Tobacco Cessation No Change    Warm-up and Cool-down Performed as group-led instruction    Resistance Training Performed Yes    VAD Patient? No    PAD/SET Patient? No      Pain Assessment   Currently in Pain? No/denies    Multiple Pain Sites No             Capillary Blood Glucose: No results found for this or any previous visit (from the past 24 hour(s)).   Exercise Prescription Changes - 01/27/23 1400       Response to Exercise   Blood Pressure (Admit) 126/76    Blood Pressure (Exercise) 166/100    Blood Pressure (Exit) 138/72    Heart Rate (Admit) 58 bpm    Heart Rate (Exercise) 84 bpm    Heart Rate (Exit) 61 bpm    Oxygen Saturation (Admit) 93 %    Oxygen Saturation (Exercise) 91 %    Oxygen Saturation (Exit) 94 %    Rating of Perceived Exertion (Exercise) 14    Perceived Dyspnea (Exercise) 3    Duration Continue with 30 min of aerobic exercise without signs/symptoms of physical distress.    Intensity THRR unchanged      Progression   Progression Continue to progress workloads to maintain intensity without signs/symptoms of physical distress.     Average METs 4      Resistance Training   Training Prescription Yes    Weight black bands    Reps 10-15    Time 10 Minutes      Treadmill   MPH 2.5    Grade 1    Minutes 15    METs 3.26      Elliptical   Level 2    Speed 2    Minutes 15    METs 4             Social History   Tobacco Use  Smoking Status Every Day   Current packs/day: 1.00   Average packs/day: 1 pack/day for 46.6 years (46.6 ttl pk-yrs)   Types: Cigarettes   Start date: 06/25/1976  Smokeless Tobacco Never  Tobacco Comments   smokes 1 ppd    Goals Met:  Independence with exercise equipment Exercise tolerated well No report of concerns or symptoms today Strength training completed today  Goals Unmet:  Not Applicable  Comments: Service time is from 1315 to 1442.    Dr. Mechele Collin is Medical Director for Pulmonary Rehab at [facility].

## 2023-01-28 NOTE — Progress Notes (Signed)
Pulmonary Individual Treatment Plan  Patient Details  Name: Brad Massey MRN: [id number] Date of Birth: May 07, 1955 Referring Provider:   Doristine Devoid Pulmonary Rehab Walk Test from 01/05/2023 in [facility] for Heart, V[facility], & Lung Health  Referring Provider [name]       Initial Encounter Date:  Flowsheet Row Pulmonary Rehab Walk Test from 01/05/2023 in [facility] for Heart, Vascular, & Lung Health  Date 01/05/23       Visit Diagnosis: Stage 3 severe COPD by GOLD classification (HCC)  Patient's Home Medications on Admission:   Current Outpatient Medications:    albuterol (VENTOLIN HFA) 108 (90 Base) MCG/ACT inhaler, Inhale into the lungs every 6 (six) hours as needed for wheezing or shortness of breath., Disp: , Rfl:    aspirin EC 81 MG tablet, Take 1 tablet (81 mg total) by mouth daily. Swallow whole., Disp: 90 tablet, Rfl: 3   Budeson-Glycopyrrol-Formoterol (BREZTRI AEROSPHERE) 160-9-4.8 MCG/ACT AERO, Inhale 2 puffs into the lungs in the morning and at bedtime., Disp: 10.7 g, Rfl: 5   nitroGLYCERIN (NITROSTAT) 0.4 MG SL tablet, Place 1 tablet (0.4 mg total) under the tongue every 5 (five) minutes as needed for chest pain., Disp: 25 tablet, Rfl: 3   rosuvastatin (CRESTOR) 40 MG tablet, Take 40 mg by mouth daily., Disp: , Rfl:    verapamil (CALAN) 120 MG tablet, Take 120 mg by mouth 3 (three) times daily., Disp: , Rfl:   Current Facility-Administered Medications:    0.9 %  sodium chloride infusion, 500 mL, Intravenous, Continuous, Armbruster, Willaim Rayas, MD  Past Medical History: Past Medical History:  Diagnosis Date   Cluster headache    COPD (chronic obstructive pulmonary disease) (HCC)    Coronary artery disease    DVT (deep venous thrombosis) (HCC)    Heart murmur    Hypertension     Tobacco Use: Social History   Tobacco Use  Smoking Status Every Day   Current packs/day: 1.00   Average packs/day: 1 pack/day for 46.6  years (46.6 ttl pk-yrs)   Types: Cigarettes   Start date: 06/25/1976  Smokeless Tobacco Never  Tobacco Comments   smokes 1 ppd    Labs: Review Flowsheet       Latest Ref Rng & Units 04/12/2020  Labs for ITP Cardiac and Pulmonary Rehab  Cholestrol 100 - 199 mg/dL 409   LDL (calc) 0 - 99 mg/dL 57   HDL-C >81 mg/dL 46   Trlycerides 0 - 191 mg/dL 66     Details            Capillary Blood Glucose: No results found for: "GLUCAP"   Pulmonary Assessment Scores:  Pulmonary Assessment Scores     Row Name 01/05/23 1153         ADL UCSD   ADL Phase Entry     SOB Score total 18       CAT Score   CAT Score 12       mMRC Score   mMRC Score 2             UCSD: Self-administered rating of dyspnea associated with activities of daily living (ADLs) 6-point scale (0 = "not at all" to 5 = "maximal or unable to do because of breathlessness")  Scoring Scores range from 0 to 120.  Minimally important difference is 5 units  CAT: CAT can identify the health impairment of COPD patients and is better correlated with disease progression.  CAT has a  scoring range of zero to 40. The CAT score is classified into four groups of low (less than 10), medium (10 - 20), high (21-30) and very high (31-40) based on the impact level of disease on health status. A CAT score over 10 suggests significant symptoms.  A worsening CAT score could be explained by an exacerbation, poor medication adherence, poor inhaler technique, or progression of COPD or comorbid conditions.  CAT MCID is 2 points  mMRC: mMRC (Modified Medical Research Council) Dyspnea Scale is used to assess the degree of baseline functional disability in patients of respiratory disease due to dyspnea. No minimal important difference is established. A decrease in score of 1 point or greater is considered a positive change.   Pulmonary Function Assessment:  Pulmonary Function Assessment - 01/05/23 1121       Breath   Bilateral  Breath Sounds Clear    Shortness of Breath Yes;Limiting activity             Exercise Target Goals: Exercise Program Goal: Individual exercise prescription set using results from initial 6 min walk test and THRR while considering  patient's activity barriers and safety.   Exercise Prescription Goal: Initial exercise prescription builds to 30-45 minutes a day of aerobic activity, 2-3 days per week.  Home exercise guidelines will be given to patient during program as part of exercise prescription that the participant will acknowledge.  Activity Barriers & Risk Stratification:  Activity Barriers & Cardiac Risk Stratification - 01/05/23 1117       Activity Barriers & Cardiac Risk Stratification   Activity Barriers Deconditioning;Muscular Weakness;Shortness of Breath    Cardiac Risk Stratification Low             6 Minute Walk:  6 Minute Walk     Row Name 01/05/23 1225         6 Minute Walk   Phase Initial     Distance 1382 feet     Walk Time 6 minutes     # of Rest Breaks 0     MPH 2.62     METS 3.31     RPE 8     Perceived Dyspnea  0     VO2 Peak 11.57     Symptoms No     Resting HR 64 bpm     Resting BP 118/70     Resting Oxygen Saturation  95 %     Exercise Oxygen Saturation  during 6 min walk 94 %     Max Ex. HR 88 bpm     Max Ex. BP 120/70     2 Minute Post BP 112/70       Interval HR   1 Minute HR 85     2 Minute HR 88     3 Minute HR 80     4 Minute HR 82     5 Minute HR 79     6 Minute HR 79     2 Minute Post HR 64     Interval Heart Rate? Yes       Interval Oxygen   Interval Oxygen? Yes     Baseline Oxygen Saturation % 95 %     1 Minute Oxygen Saturation % 96 %     1 Minute Liters of Oxygen 0 L     2 Minute Oxygen Saturation % 91 %     2 Minute Liters of Oxygen 0 L     3 Minute Oxygen Saturation %  95 %     3 Minute Liters of Oxygen 0 L     4 Minute Oxygen Saturation % 96 %     4 Minute Liters of Oxygen 0 L     5 Minute Oxygen  Saturation % 95 %     5 Minute Liters of Oxygen 0 L     6 Minute Oxygen Saturation % 96 %     6 Minute Liters of Oxygen 0 L     2 Minute Post Oxygen Saturation % 96 %     2 Minute Post Liters of Oxygen 0 L              Oxygen Initial Assessment:  Oxygen Initial Assessment - 01/05/23 1119       Home Oxygen   Home Oxygen Device None    Sleep Oxygen Prescription None    Home Exercise Oxygen Prescription None    Home Resting Oxygen Prescription None      Initial 6 min Walk   Oxygen Used None      Program Oxygen Prescription   Program Oxygen Prescription None      Intervention   Short Term Goals To learn and understand importance of maintaining oxygen saturations>88%;To learn and demonstrate proper use of respiratory medications;To learn and understand importance of monitoring SPO2 with pulse oximeter and demonstrate accurate use of the pulse oximeter.;To learn and demonstrate proper pursed lip breathing techniques or other breathing techniques.     Long  Term Goals Maintenance of O2 saturations>88%;Compliance with respiratory medication;Verbalizes importance of monitoring SPO2 with pulse oximeter and return demonstration;Exhibits proper breathing techniques, such as pursed lip breathing or other method taught during program session;Demonstrates proper use of MDI's             Oxygen Re-Evaluation:  Oxygen Re-Evaluation     Row Name 01/20/23 0852             Program Oxygen Prescription   Program Oxygen Prescription None         Home Oxygen   Home Oxygen Device None       Sleep Oxygen Prescription None       Home Exercise Oxygen Prescription None       Home Resting Oxygen Prescription None         Goals/Expected Outcomes   Short Term Goals To learn and understand importance of maintaining oxygen saturations>88%;To learn and demonstrate proper use of respiratory medications;To learn and understand importance of monitoring SPO2 with pulse oximeter and demonstrate  accurate use of the pulse oximeter.;To learn and demonstrate proper pursed lip breathing techniques or other breathing techniques.        Long  Term Goals Maintenance of O2 saturations>88%;Compliance with respiratory medication;Verbalizes importance of monitoring SPO2 with pulse oximeter and return demonstration;Exhibits proper breathing techniques, such as pursed lip breathing or other method taught during program session;Demonstrates proper use of MDI's       Goals/Expected Outcomes Compliance and understanding of oxygen saturation monitoring and breathing techniques to decrease shortness of breath.                Oxygen Discharge (Final Oxygen Re-Evaluation):  Oxygen Re-Evaluation - 01/20/23 0852       Program Oxygen Prescription   Program Oxygen Prescription None      Home Oxygen   Home Oxygen Device None    Sleep Oxygen Prescription None    Home Exercise Oxygen Prescription None    Home Resting Oxygen Prescription None  Goals/Expected Outcomes   Short Term Goals To learn and understand importance of maintaining oxygen saturations>88%;To learn and demonstrate proper use of respiratory medications;To learn and understand importance of monitoring SPO2 with pulse oximeter and demonstrate accurate use of the pulse oximeter.;To learn and demonstrate proper pursed lip breathing techniques or other breathing techniques.     Long  Term Goals Maintenance of O2 saturations>88%;Compliance with respiratory medication;Verbalizes importance of monitoring SPO2 with pulse oximeter and return demonstration;Exhibits proper breathing techniques, such as pursed lip breathing or other method taught during program session;Demonstrates proper use of MDI's    Goals/Expected Outcomes Compliance and understanding of oxygen saturation monitoring and breathing techniques to decrease shortness of breath.             Initial Exercise Prescription:  Initial Exercise Prescription - 01/05/23 1200        Date of Initial Exercise RX and Referring Provider   Date 01/05/23    Referring Provider [name]    Expected Discharge Date 04/02/23      Treadmill   MPH 2.5    Grade 0    Minutes 15      Elliptical   Level 1    Speed 1    Minutes 15      Prescription Details   Frequency (times per week) 2    Duration Progress to 30 minutes of continuous aerobic without signs/symptoms of physical distress      Intensity   THRR 40-80% of Max Heartrate 62-124    Ratings of Perceived Exertion 11-13    Perceived Dyspnea 0-4      Progression   Progression Continue to progress workloads to maintain intensity without signs/symptoms of physical distress.      Resistance Training   Training Prescription Yes    Weight blue bands    Reps 10-15             Perform Capillary Blood Glucose checks as needed.  Exercise Prescription Changes:   Exercise Prescription Changes     Row Name 01/13/23 1500 01/27/23 1400           Response to Exercise   Blood Pressure (Admit) 140/72 126/76      Blood Pressure (Exercise) 154/80 166/100      Blood Pressure (Exit) 118/72 138/72      Heart Rate (Admit) 83 bpm 58 bpm      Heart Rate (Exercise) 107 bpm 84 bpm      Heart Rate (Exit) 85 bpm 61 bpm      Oxygen Saturation (Admit) 95 % 93 %      Oxygen Saturation (Exercise) 93 % 91 %      Oxygen Saturation (Exit) 95 % 94 %      Rating of Perceived Exertion (Exercise) 11 14      Perceived Dyspnea (Exercise) 1 3      Duration Progress to 30 minutes of  aerobic without signs/symptoms of physical distress Continue with 30 min of aerobic exercise without signs/symptoms of physical distress.      Intensity THRR unchanged THRR unchanged        Progression   Progression Continue to progress workloads to maintain intensity without signs/symptoms of physical distress. Continue to progress workloads to maintain intensity without signs/symptoms of physical distress.      Average METs -- 4        Resistance  Training   Training Prescription Yes Yes      Weight black bands black bands  Reps 10-15 10-15      Time 10 Minutes 10 Minutes        Treadmill   MPH 2 2.5      Grade 0 1      Minutes 15 15      METs 2.53 3.26        Elliptical   Level 1 2      Speed 1 2      Minutes 15 15      METs 4 4               Exercise Comments:   Exercise Comments     Row Name 01/13/23 1524           Exercise Comments Pt completed his first day of group exercise. He exercised on the upright elliptical for 15 min, level 1, incline 1, METs 4.0. He then walked on the treadmill for 15 min, speed 2.0, incline 0, METs 2.53. Pt tolerated well without c/o. Performed warm up and cool down with verbal cues. Discussed METs with good reception.                Exercise Goals and Review:   Exercise Goals     Row Name 01/05/23 1117             Exercise Goals   Increase Physical Activity Yes       Intervention Provide advice, education, support and counseling about physical activity/exercise needs.;Develop an individualized exercise prescription for aerobic and resistive training based on initial evaluation findings, risk stratification, comorbidities and participant's personal goals.       Expected Outcomes Short Term: Attend rehab on a regular basis to increase amount of physical activity.;Long Term: Exercising regularly at least 3-5 days a week.;Long Term: Add in home exercise to make exercise part of routine and to increase amount of physical activity.       Increase Strength and Stamina Yes       Intervention Provide advice, education, support and counseling about physical activity/exercise needs.;Develop an individualized exercise prescription for aerobic and resistive training based on initial evaluation findings, risk stratification, comorbidities and participant's personal goals.       Expected Outcomes Short Term: Increase workloads from initial exercise prescription for resistance,  speed, and METs.;Short Term: Perform resistance training exercises routinely during rehab and add in resistance training at home;Long Term: Improve cardiorespiratory fitness, muscular endurance and strength as measured by increased METs and functional capacity ( )       Able to understand and use rate of perceived exertion (RPE) scale Yes       Intervention Provide education and explanation on how to use RPE scale       Expected Outcomes Short Term: Able to use RPE daily in rehab to express subjective intensity level;Long Term:  Able to use RPE to guide intensity level when exercising independently       Able to understand and use Dyspnea scale Yes       Intervention Provide education and explanation on how to use Dyspnea scale       Expected Outcomes Short Term: Able to use Dyspnea scale daily in rehab to express subjective sense of shortness of breath during exertion;Long Term: Able to use Dyspnea scale to guide intensity level when exercising independently       Knowledge and understanding of Target Heart Rate Range (THRR) Yes       Intervention Provide education and explanation of THRR including how the numbers  were predicted and where they are located for reference       Expected Outcomes Short Term: Able to state/look up THRR;Long Term: Able to use THRR to govern intensity when exercising independently;Short Term: Able to use daily as guideline for intensity in rehab       Understanding of Exercise Prescription Yes       Intervention Provide education, explanation, and written materials on patient's individual exercise prescription       Expected Outcomes Short Term: Able to explain program exercise prescription;Long Term: Able to explain home exercise prescription to exercise independently                Exercise Goals Re-Evaluation :  Exercise Goals Re-Evaluation     Row Name 01/20/23 0849             Exercise Goal Re-Evaluation   Exercise Goals Review Increase Physical  Activity;Able to understand and use Dyspnea scale;Understanding of Exercise Prescription;Increase Strength and Stamina;Knowledge and understanding of Target Heart Rate Range (THRR);Able to understand and use rate of perceived exertion (RPE) scale       Comments Pt has completed two days of group exercise. He will miss today for a vacation. He is exercising on the upright elliptical for 15 min, level 1, incline 1, METs 4.0. He then is walking on the treadmill for 15 min, speed 2.0, incline 0, METs 2.53. Plan to increase his workload next session as he is tolerating exercise well. He performs warm up and cool down without limitations.       Expected Outcomes Through exercise at rehab and home, the patient will decrease shortness of breath with daily activities and feel confident in carrying out an exercise regimen at home                Discharge Exercise Prescription (Final Exercise Prescription Changes):  Exercise Prescription Changes - 01/27/23 1400       Response to Exercise   Blood Pressure (Admit) 126/76    Blood Pressure (Exercise) 166/100    Blood Pressure (Exit) 138/72    Heart Rate (Admit) 58 bpm    Heart Rate (Exercise) 84 bpm    Heart Rate (Exit) 61 bpm    Oxygen Saturation (Admit) 93 %    Oxygen Saturation (Exercise) 91 %    Oxygen Saturation (Exit) 94 %    Rating of Perceived Exertion (Exercise) 14    Perceived Dyspnea (Exercise) 3    Duration Continue with 30 min of aerobic exercise without signs/symptoms of physical distress.    Intensity THRR unchanged      Progression   Progression Continue to progress workloads to maintain intensity without signs/symptoms of physical distress.    Average METs 4      Resistance Training   Training Prescription Yes    Weight black bands    Reps 10-15    Time 10 Minutes      Treadmill   MPH 2.5    Grade 1    Minutes 15    METs 3.26      Elliptical   Level 2    Speed 2    Minutes 15    METs 4              Nutrition:  Target Goals: Understanding of nutrition guidelines, daily intake of sodium 1500mg , cholesterol 200mg , calories 30% from fat and 7% or less from saturated fats, daily to have 5 or more servings of fruits and vegetables.  Biometrics:  Pre Biometrics - 01/05/23 1258       Pre Biometrics   Grip Strength 40 kg              Nutrition Therapy Plan and Nutrition Goals:  Nutrition Therapy & Goals - 01/13/23 1406       Nutrition Therapy   Diet General Healthy Diet    Drug/Food Interactions Statins/Certain Fruits      Personal Nutrition Goals   Nutrition Goal Patient to improve diet quality by using the plate method as a guide for meal planning to include lean protein/plant protein, fruits, vegetables, whole grains, nonfat dairy as part of a well-balanced diet.   goal in progress.   Comments Daelen reports no nutrition concerns/questions at this time. He reports eating a wide variety of foods with increased focus on fiber intake related to recent hemrroid surgery. 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 He reports normal bowel movements with consistent dietary fiber intake. He remains pre-contemplative toward smoking cessation. Patient will benefit from participation in intensive cardiac rehab for nutrition, exercise, and lifestyle modification.      Intervention Plan   Intervention Prescribe, educate and counsel regarding individualized specific dietary modifications aiming towards targeted core components such as weight, hypertension, lipid management, diabetes, heart failure and other comorbidities.;Nutrition handout(s) given to patient.    Expected Outcomes Short Term Goal: Understand basic principles of dietary content, such as calories, fat, sodium, cholesterol and nutrients.;Long Term Goal: Adherence to prescribed nutrition plan.             Nutrition Assessments:  Nutrition  Assessments - 01/13/23 1510       Rate Your Plate Scores   Pre Score 48            MEDIFICTS Score Key: >=70 Need to make dietary changes  40-70 Heart Healthy Diet <= 40 Therapeutic Level Cholesterol Diet  Flowsheet Row PULMONARY REHAB CHRONIC OBSTRUCTIVE PULMONARY DISEASE from 01/13/2023 in [facility] for Heart, Vascular, & Lung Health  Picture Your Plate Total Score on Admission 48      Picture Your Plate Scores: <82 Unhealthy dietary pattern with much room for improvement. 41-50 Dietary pattern unlikely to meet recommendations for good health and room for improvement. 51-60 More healthful dietary pattern, with some room for improvement.  >60 Healthy dietary pattern, although there may be some specific behaviors that could be improved.    Nutrition Goals Re-Evaluation:  Nutrition Goals Re-Evaluation     Row Name 01/13/23 1406             Goals   Current Weight 185 lb 13.6 oz (84.3 kg)       Comment labs not available from outside provider [facility]. Per Patient labs WNL       Expected Outcome Darrell reports no nutrition concerns/questions at this time. He reports eating a wide variety of foods with increased focus on fiber intake related to recent hemrroid surgery. 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 He reports normal bowel movements with consistent dietary fiber intake. He remains pre-contemplative toward smoking cessation. Patient will benefit from participation in intensive cardiac rehab for nutrition, exercise, and lifestyle modification.                Nutrition Goals Discharge (Final Nutrition Goals Re-Evaluation):  Nutrition Goals Re-Evaluation - 01/13/23 1406       Goals  Current Weight 185 lb 13.6 oz (84.3 kg)    Comment labs not available from outside provider [facility]. Per Patient labs WNL    Expected Outcome Jasiel reports no  nutrition concerns/questions at this time. He reports eating a wide variety of foods with increased focus on fiber intake related to recent hemrroid surgery. 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 He reports normal bowel movements with consistent dietary fiber intake. He remains pre-contemplative toward smoking cessation. Patient will benefit from participation in intensive cardiac rehab for nutrition, exercise, and lifestyle modification.             Psychosocial: Target Goals: Acknowledge presence or absence of significant depression and/or stress, maximize coping skills, provide positive support system. Participant is able to verbalize types and ability to use techniques and skills needed for reducing stress and depression.  Initial Review & Psychosocial Screening:  Initial Psych Review & Screening - 01/05/23 1112       Initial Review   Current issues with None Identified      Family Dynamics   Good Support System? Yes      Barriers   Psychosocial barriers to participate in program There are no identifiable barriers or psychosocial needs.      Screening Interventions   Interventions Encouraged to exercise             Quality of Life Scores:  Scores of 19 and below usually indicate a poorer quality of life in these areas.  A difference of  2-3 points is a clinically meaningful difference.  A difference of 2-3 points in the total score of the Quality of Life Index has been associated with significant improvement in overall quality of life, self-image, physical symptoms, and general health in studies assessing change in quality of life.  PHQ-9: Review Flowsheet       01/05/2023  Depression screen PHQ 2/9  Decreased Interest 0  Down, Depressed, Hopeless 0  PHQ - 2 Score 0  Altered sleeping 1  Tired, decreased energy 1  Change in appetite 0  Feeling bad or failure about yourself  0  Trouble  concentrating 0  Moving slowly or fidgety/restless 0  Suicidal thoughts 0  PHQ-9 Score 2  Difficult doing work/chores Not difficult at all    Details           Interpretation of Total Score  Total Score Depression Severity:  1-4 = Minimal depression, 5-9 = Mild depression, 10-14 = Moderate depression, 15-19 = Moderately severe depression, 20-27 = Severe depression   Psychosocial Evaluation and Intervention:  Psychosocial Evaluation - 01/05/23 1113       Psychosocial Evaluation & Interventions   Interventions Encouraged to exercise with the program and follow exercise prescription    Comments Talen denies any psychosocial barriers or concerns at this time.    Expected Outcomes For Jospeh to participate in PR free of any psychosocial barriers or concerns    Continue Psychosocial Services  No Follow up required             Psychosocial Re-Evaluation:  Psychosocial Re-Evaluation     Row Name 01/21/23 1047             Psychosocial Re-Evaluation   Current issues with None Identified       Comments Jolly denies any psychosocial issues or problems at time of eval. He started the program on 9/17 and has attended 2 classes so far.  Expected Outcomes For Mathis to continue to attend PR without any psychosocial barriers or concerns       Continue Psychosocial Services  No Follow up required                Psychosocial Discharge (Final Psychosocial Re-Evaluation):  Psychosocial Re-Evaluation - 01/21/23 1047       Psychosocial Re-Evaluation   Current issues with None Identified    Comments Ryheem denies any psychosocial issues or problems at time of eval. He started the program on 9/17 and has attended 2 classes so far.    Expected Outcomes For Tashaun to continue to attend PR without any psychosocial barriers or concerns    Continue Psychosocial Services  No Follow up required             Education: Education Goals: Education classes will be provided on a weekly  basis, covering required topics. Participant will state understanding/return demonstration of topics presented.  Learning Barriers/Preferences:  Learning Barriers/Preferences - 01/05/23 1114       Learning Barriers/Preferences   Learning Barriers Sight   wears glasses, needs cataract surgery   Learning Preferences Group Instruction;Individual Instruction;Skilled Demonstration             Education Topics: Know Your Numbers Group instruction that is supported by a PowerPoint presentation. Instructor discusses importance of knowing and understanding resting, exercise, and post-exercise oxygen saturation, heart rate, and blood pressure. Oxygen saturation, heart rate, blood pressure, rating of perceived exertion, and dyspnea are reviewed along with a normal range for these values.    Exercise for the Pulmonary Patient Group instruction that is supported by a PowerPoint presentation. Instructor discusses benefits of exercise, core components of exercise, frequency, duration, and intensity of an exercise routine, importance of utilizing pulse oximetry during exercise, safety while exercising, and options of places to exercise outside of rehab.  Flowsheet Row PULMONARY REHAB CHRONIC OBSTRUCTIVE PULMONARY DISEASE from 01/22/2023 in [facility]  Date 01/22/23  Educator EP  Instruction Review Code 1- Verbalizes Understanding       MET Level  Group instruction provided by PowerPoint, verbal discussion, and written material to support subject matter. Instructor reviews what METs are and how to increase METs.    Pulmonary Medications Verbally interactive group education provided by instructor with focus on inhaled medications and proper administration. Flowsheet Row PULMONARY REHAB CHRONIC OBSTRUCTIVE PULMONARY DISEASE from 01/15/2023 in [facility]  Date 01/15/23  Educator RT   Instruction Review Code 1- Verbalizes Understanding       Anatomy and Physiology of the Respiratory System Group instruction provided by PowerPoint, verbal discussion, and written material to support subject matter. Instructor reviews respiratory cycle and anatomical components of the respiratory system and their functions. Instructor also reviews differences in obstructive and restrictive respiratory diseases with examples of each.    Oxygen Safety Group instruction provided by PowerPoint, verbal discussion, and written material to support subject matter. There is an overview of "What is Oxygen" and "Why do we need it".  Instructor also reviews how to create a safe environment for oxygen use, the importance of using oxygen as prescribed, and the risks of noncompliance. There is a brief discussion on traveling with oxygen and resources the patient may utilize.   Oxygen Use Group instruction provided by PowerPoint, verbal discussion, and written material to discuss how supplemental oxygen is prescribed and different types of oxygen supply systems.  Resources for more information are provided.    Breathing Techniques Group instruction that is supported by demonstration and informational handouts. Instructor discusses the benefits of pursed lip and diaphragmatic breathing and detailed demonstration on how to perform both.     Risk Factor Reduction Group instruction that is supported by a PowerPoint presentation. Instructor discusses the definition of a risk factor, different risk factors for pulmonary disease, and how the heart and lungs work together.   Pulmonary Diseases Group instruction provided by PowerPoint, verbal discussion, and written material to support subject matter. Instructor gives an overview of the different type of pulmonary diseases. There is also a discussion on risk factors and symptoms as well as ways to manage the diseases.   Stress and Energy Conservation Group  instruction provided by PowerPoint, verbal discussion, and written material to support subject matter. Instructor gives an overview of stress and the impact it can have on the body. Instructor also reviews ways to reduce stress. There is also a discussion on energy conservation and ways to conserve energy throughout the day.   Warning Signs and Symptoms Group instruction provided by PowerPoint, verbal discussion, and written material to support subject matter. Instructor reviews warning signs and symptoms of stroke, heart attack, cold and flu. Instructor also reviews ways to prevent the spread of infection.   Other Education Group or individual verbal, written, or video instructions that support the educational goals of the pulmonary rehab program.    Knowledge Questionnaire Score:  Knowledge Questionnaire Score - 01/05/23 1153       Knowledge Questionnaire Score   Pre Score 14/18             Core Components/Risk Factors/Patient Goals at Admission:  Personal Goals and Risk Factors at Admission - 01/05/23 1115       Core Components/Risk Factors/Patient Goals on Admission    Weight Management Weight Loss;Yes    Intervention Weight Management: Develop a combined nutrition and exercise program designed to reach desired caloric intake, while maintaining appropriate intake of nutrient and fiber, sodium and fats, and appropriate energy expenditure required for the weight goal.;Weight Management: Provide education and appropriate resources to help participant work on and attain dietary goals.;Weight Management/Obesity: Establish reasonable short term and long term weight goals.;Obesity: Provide education and appropriate resources to help participant work on and attain dietary goals.    Expected Outcomes Short Term: Continue to assess and modify interventions until short term weight is achieved;Long Term: Adherence to nutrition and physical activity/exercise program aimed toward attainment of  established weight goal;Weight Loss: Understanding of general recommendations for a balanced deficit meal plan, which promotes 1-2 lb weight loss per week and includes a negative energy balance of [phone] kcal/d;Understanding recommendations for meals to include 15-35% energy as protein, 25-35% energy from fat, 35-60% energy from carbohydrates, less than 200mg  of dietary cholesterol, 20-35 gm of total fiber daily;Understanding of distribution of calorie intake throughout the day with the consumption of 4-5 meals/snacks    Tobacco Cessation Yes    Number of packs per day 1ppd    Intervention Assist the participant in steps to quit. Provide individualized education and counseling about committing to Tobacco Cessation, relapse prevention, and pharmacological support that can be provided by physician.;Education officer, environmental, assist with locating and accessing local/national Quit Smoking programs, and support quit date choice.    Expected Outcomes Short Term: Will demonstrate readiness to quit, by selecting a quit date.;Short Term: Will quit all tobacco product use, adhering to prevention of relapse plan.;Long  Term: Complete abstinence from all tobacco products for at least 12 months from quit date.    Improve shortness of breath with ADL's Yes    Intervention Provide education, individualized exercise plan and daily activity instruction to help decrease symptoms of SOB with activities of daily living.    Expected Outcomes Short Term: Improve cardiorespiratory fitness to achieve a reduction of symptoms when performing ADLs;Long Term: Be able to perform more ADLs without symptoms or delay the onset of symptoms    Increase knowledge of respiratory medications and ability to use respiratory devices properly  Yes    Intervention Provide education and demonstration as needed of appropriate use of medications, inhalers, and oxygen therapy.    Expected Outcomes Short Term: Achieves understanding of medications  use. Understands that oxygen is a medication prescribed by physician. Demonstrates appropriate use of inhaler and oxygen therapy.;Long Term: Maintain appropriate use of medications, inhalers, and oxygen therapy.             Core Components/Risk Factors/Patient Goals Review:   Goals and Risk Factor Review     Row Name 01/21/23 1048 01/22/23 1540           Core Components/Risk Factors/Patient Goals Review   Personal Goals Review Weight Management/Obesity;Tobacco Cessation;Improve shortness of breath with ADL's;Develop more efficient breathing techniques such as purse lipped breathing and diaphragmatic breathing and practicing self-pacing with activity. Tobacco Cessation      Review Unable to assess goals. Savan has completed 2 exercise sessions. Discussed with pt readiness for smoking cessation. He sts that he thinks about quitting daily but he has never actually made any actions steps to quit. Gave pt resources to view and consider the effects of smoking. Encouraged pt to consider action in quitting. He agreed. Will f/u.      Expected Outcomes For Hannibal to lose weight, quit smoking, improve his shortness of breath with ADLs, and develop more efficient breathing techniques and learn how to self pace For pt to choose an action plan and set a quit date.               Core Components/Risk Factors/Patient Goals at Discharge (Final Review):   Goals and Risk Factor Review - 01/22/23 1540       Core Components/Risk Factors/Patient Goals Review   Personal Goals Review Tobacco Cessation    Review Discussed with pt readiness for smoking cessation. He sts that he thinks about quitting daily but he has never actually made any actions steps to quit. Gave pt resources to view and consider the effects of smoking. Encouraged pt to consider action in quitting. He agreed. Will f/u.    Expected Outcomes For pt to choose an action plan and set a quit date.             ITP Comments:Pt is making  expected progress toward Pulmonary Rehab goals after completing 4 sessions. Recommend continued exercise, life style modification, education, and utilization of breathing techniques to increase stamina and strength, while also decreasing shortness of breath with exertion.  Dr. Mechele Collin is Medical Director for Pulmonary Rehab at [facility].     Comments: Dr. Mechele Collin is Medical Director for Pulmonary Rehab at [facility].

## 2023-01-29 ENCOUNTER — Encounter (HOSPITAL_COMMUNITY)
Admission: RE | Admit: 2023-01-29 | Discharge: 2023-01-29 | Disposition: A | Discharge status: Home / Self Care | Payer: Medicare Other | Source: Ambulatory Visit | Attending: Pulmonary Disease | Admitting: Pulmonary Disease

## 2023-01-29 DIAGNOSIS — J449 Chronic obstructive pulmonary disease, unspecified: Secondary | ICD-10-CM | POA: Diagnosis not present

## 2023-01-29 DIAGNOSIS — F17210 Nicotine dependence, cigarettes, uncomplicated: Secondary | ICD-10-CM | POA: Diagnosis not present

## 2023-01-29 NOTE — Progress Notes (Signed)
Daily Session Note  Patient Details  Name: Brad Massey MRN: [id number] Date of Birth: 1955-07-24 Referring Provider:   Doristine Devoid Pulmonary Rehab Walk Test from 01/05/2023 in [facility]  Referring Provider [name]       Encounter Date: 01/29/2023  Check In:  Session Check In - 01/29/23 1427       Check-In   Supervising physician immediately available to respond to emergencies [facility] MD immediately available    Physician(s) Joni Reining, NP    Location [facility]    Staff Present Essie Hart, RN, BSN;Stephnie Parlier Katrinka Blazing, Zella Richer, MS, ACSM-CEP, Exercise Physiologist;Randi Dionisio Paschal, ACSM-CEP, Exercise Physiologist    Virtual Visit No    Medication changes reported     No    Fall or balance concerns reported    No    Tobacco Cessation No Change    Warm-up and Cool-down Performed as group-led instruction    Resistance Training Performed Yes    VAD Patient? No    PAD/SET Patient? No      Pain Assessment   Currently in Pain? No/denies    Multiple Pain Sites No             Capillary Blood Glucose: No results found for this or any previous visit (from the past 24 hour(s)).    Social History   Tobacco Use  Smoking Status Every Day   Current packs/day: 1.00   Average packs/day: 1 pack/day for 46.6 years (46.6 ttl pk-yrs)   Types: Cigarettes   Start date: 06/25/1976  Smokeless Tobacco Never  Tobacco Comments   smokes 1 ppd    Goals Met:  Proper associated with RPD/PD & O2 Sat Independence with exercise equipment Exercise tolerated well No report of concerns or symptoms today Strength training completed today  Goals Unmet:  Not Applicable  Comments: Service time is from 1312 to 1356.    Dr. Mechele Collin is Medical Director for Pulmonary Rehab at [facility].

## 2023-02-03 ENCOUNTER — Encounter (HOSPITAL_COMMUNITY): Payer: Medicare Other

## 2023-02-05 ENCOUNTER — Encounter (HOSPITAL_COMMUNITY): Payer: Medicare Other

## 2023-02-10 ENCOUNTER — Encounter (HOSPITAL_COMMUNITY)
Admission: RE | Admit: 2023-02-10 | Discharge: 2023-02-10 | Disposition: A | Discharge status: Home / Self Care | Payer: Medicare Other | Source: Ambulatory Visit | Attending: Pulmonary Disease | Admitting: Pulmonary Disease

## 2023-02-10 ENCOUNTER — Encounter (HOSPITAL_COMMUNITY): Payer: Self-pay

## 2023-02-10 VITALS — Wt 188.1 lb

## 2023-02-10 DIAGNOSIS — J449 Chronic obstructive pulmonary disease, unspecified: Secondary | ICD-10-CM | POA: Diagnosis not present

## 2023-02-10 DIAGNOSIS — F17210 Nicotine dependence, cigarettes, uncomplicated: Secondary | ICD-10-CM | POA: Diagnosis not present

## 2023-02-10 NOTE — Progress Notes (Signed)
Daily Session Note  Patient Details  Name: Brad Massey MRN: [id number] Date of Birth: Oct 31, 1955 Referring Provider:   Doristine Devoid Pulmonary Rehab Walk Test from 01/05/2023 in [facility]  Referring Provider [name]       Encounter Date: 02/10/2023  Check In:  Session Check In - 02/10/23 1426       Check-In   Supervising physician immediately available to respond to emergencies [facility] MD immediately available    Physician(s) Jari Favre, NP    [facility]    Staff Present Essie Hart, RN, BSN;Casey Katrinka Blazing, RT;Randi [place] BS, ACSM-CEP, Exercise Physiologist;Samantha Belarus, RD, LDN    Virtual Visit No    Medication changes reported     No    Fall or balance concerns reported    No    Tobacco Cessation No Change    Warm-up and Cool-down Performed as group-led instruction    Resistance Training Performed Yes    VAD Patient? No    PAD/SET Patient? No      Pain Assessment   Currently in Pain? No/denies    Multiple Pain Sites No             Capillary Blood Glucose: No results found for this or any previous visit (from the past 24 hour(s)).   Exercise Prescription Changes - 02/10/23 1500       Response to Exercise   Blood Pressure (Admit) 126/74    Blood Pressure (Exercise) 142/58    Blood Pressure (Exit) 138/80    Heart Rate (Admit) 59 bpm    Heart Rate (Exercise) 89 bpm    Heart Rate (Exit) 65 bpm    Oxygen Saturation (Admit) 94 %    Oxygen Saturation (Exercise) 93 %    Oxygen Saturation (Exit) 93 %    Rating of Perceived Exertion (Exercise) 13    Perceived Dyspnea (Exercise) 2    Duration Continue with 30 min of aerobic exercise without signs/symptoms of physical distress.    Intensity THRR unchanged      Progression   Progression Continue to progress workloads to maintain intensity without signs/symptoms of physical distress.      Resistance Training   Training Prescription  Yes    Weight black bands    Reps 10-15    Time 10 Minutes      Treadmill   MPH 2.5    Grade 1    Minutes 15    METs 3.26      Elliptical   Level 2    Speed 2    Minutes 15    METs 4.2             Social History   Tobacco Use  Smoking Status Every Day   Current packs/day: 1.00   Average packs/day: 1 pack/day for 46.6 years (46.6 ttl pk-yrs)   Types: Cigarettes   Start date: 06/25/1976  Smokeless Tobacco Never  Tobacco Comments   smokes 1 ppd    Goals Met:  Independence with exercise equipment Exercise tolerated well No report of concerns or symptoms today Strength training completed today  Goals Unmet:  Not Applicable  Comments: Service time is from 1320 to 1450  Dr. Mechele Collin is Medical Director for Pulmonary Rehab at [facility].

## 2023-02-12 ENCOUNTER — Encounter (HOSPITAL_COMMUNITY)
Admission: RE | Admit: 2023-02-12 | Discharge: 2023-02-12 | Disposition: A | Discharge status: Home / Self Care | Payer: Medicare Other | Source: Ambulatory Visit | Attending: Pulmonary Disease | Admitting: Pulmonary Disease

## 2023-02-12 DIAGNOSIS — J449 Chronic obstructive pulmonary disease, unspecified: Secondary | ICD-10-CM

## 2023-02-12 DIAGNOSIS — F17210 Nicotine dependence, cigarettes, uncomplicated: Secondary | ICD-10-CM | POA: Diagnosis not present

## 2023-02-12 NOTE — Progress Notes (Signed)
Daily Session Note  Patient Details  Name: Brad Massey MRN: [id number] Date of Birth: 07-23-1955 Referring Provider:   Doristine Devoid Pulmonary Rehab Walk Test from 01/05/2023 in [facility] for [facility], V[facility], & Lung Health  Referring Provider [name]       Encounter Date: 02/12/2023  Check In:  Session Check In - 02/12/23 1419       Check-In   Supervising physician immediately available to respond to emergencies [facility] MD immediately available    Physician(s) Jari Favre, NP    [facility]    Staff Present Essie Hart, RN, BSN;Casey Katrinka Blazing, RT;Randi Idelle Crouch BS, ACSM-CEP, Exercise Physiologist;Samantha Belarus, [place], Rexene Agent, MS, ACSM-CEP, Exercise Physiologist    Virtual Visit No    Medication changes reported     No    Fall or balance concerns reported    No    Tobacco Cessation No Change    Warm-up and Cool-down Performed as group-led instruction    Resistance Training Performed Yes    VAD Patient? No    PAD/SET Patient? No      Pain Assessment   Currently in Pain? No/denies    Multiple Pain Sites No             Capillary Blood Glucose: No results found for this or any previous visit (from the past 24 hour(s)).    Social History   Tobacco Use  Smoking Status Every Day   Current packs/day: 1.00   Average packs/day: 1 pack/day for 46.6 years (46.6 ttl pk-yrs)   Types: Cigarettes   Start date: 06/25/1976  Smokeless Tobacco Never  Tobacco Comments   smokes 1 ppd    Goals Met:  Proper associated with RPD/PD & O2 Sat Independence with exercise equipment Exercise tolerated well No report of concerns or symptoms today Strength training completed today  Goals Unmet:  Not Applicable  Comments: Service time is from 1326 to 1445.

## 2023-02-17 ENCOUNTER — Encounter (HOSPITAL_COMMUNITY)
Admission: RE | Admit: 2023-02-17 | Discharge: 2023-02-17 | Disposition: A | Discharge status: Home / Self Care | Payer: Medicare Other | Source: Ambulatory Visit | Attending: Pulmonary Disease

## 2023-02-17 DIAGNOSIS — J449 Chronic obstructive pulmonary disease, unspecified: Secondary | ICD-10-CM

## 2023-02-17 DIAGNOSIS — F17210 Nicotine dependence, cigarettes, uncomplicated: Secondary | ICD-10-CM | POA: Diagnosis not present

## 2023-02-17 NOTE — Progress Notes (Signed)
Home Exercise Prescription I have reviewed a Home Exercise Prescription with [facility]. He is currently walking 30+ min occasionally with his wife. Discussed walking 1-3 non rehab days 30 min or more. He also is a member of [employer] and I encouraged him to do similar machines there if he would like. He wants to be able to hike with his wife. The patient stated that their goals were to be able to hike better. We reviewed exercise guidelines, target heart rate during exercise, RPE Scale, weather conditions, endpoints for exercise, warmup and cool down. The patient is encouraged to come to me with any questions. I will continue to follow up with the patient to assist them with progression and safety.  Spent 15 min discussing home exercise plan and goals.  Aylinn Rydberg [place], [place], ACSM-CEP 02/17/2023 3:13 PM

## 2023-02-17 NOTE — Progress Notes (Signed)
Daily Session Note  Patient Details  Name: Brad Massey MRN: [id number] Date of Birth: 12/02/1955 Referring Provider:   Doristine Devoid Pulmonary Rehab Walk Test from 01/05/2023 in [facility], Vascular, & Lung Health  Referring Provider [name]       Encounter Date: 02/17/2023  Check In:  Session Check In - 02/17/23 1333       Check-In   Supervising physician immediately available to respond to emergencies [facility] immediately available    Physician(s) Edd Fabian, NP    [facility]    Staff Present Essie Hart, RN, BSN;Casey Katrinka Blazing, RT;Randi Idelle Crouch BS, ACSM-CEP, Exercise Physiologist;Samantha Belarus, [place], Rexene Agent, MS, ACSM-CEP, Exercise Physiologist    Virtual Visit No    Medication changes reported     No    Fall or balance concerns reported    No    Tobacco Cessation No Change    Warm-up and Cool-down Performed as group-led instruction    Resistance Training Performed Yes    VAD Patient? No    PAD/SET Patient? No      Pain Assessment   Currently in Pain? No/denies    Multiple Pain Sites No             Capillary Blood Glucose: No results found for this or any previous visit (from the past 24 hour(s)).    Social History   Tobacco Use  Smoking Status Every Day   Current packs/day: 1.00   Average packs/day: 1 pack/day for 46.6 years (46.6 ttl pk-yrs)   Types: Cigarettes   Start date: 06/25/1976  Smokeless Tobacco Never  Tobacco Comments   smokes 1 ppd    Goals Met:  Proper associated with RPD/PD & O2 Sat Independence with exercise equipment Exercise tolerated well No report of concerns or symptoms today Strength training completed today  Goals Unmet:  Not Applicable   Comments: Service time is from 1321 to 1436.  Dr. Mechele Collin is Medical Director for Pulmonary Rehab at [facility].

## 2023-02-18 DIAGNOSIS — Z23 Encounter for immunization: Secondary | ICD-10-CM | POA: Diagnosis not present

## 2023-02-19 ENCOUNTER — Encounter (HOSPITAL_COMMUNITY)
Admission: RE | Admit: 2023-02-19 | Discharge: 2023-02-19 | Disposition: A | Discharge status: Home / Self Care | Payer: Medicare Other | Source: Ambulatory Visit | Attending: Pulmonary Disease | Admitting: Pulmonary Disease

## 2023-02-19 DIAGNOSIS — J449 Chronic obstructive pulmonary disease, unspecified: Secondary | ICD-10-CM | POA: Diagnosis not present

## 2023-02-19 DIAGNOSIS — F17210 Nicotine dependence, cigarettes, uncomplicated: Secondary | ICD-10-CM | POA: Diagnosis not present

## 2023-02-19 NOTE — Progress Notes (Signed)
Daily Session Note  Patient Details  Name: Ikey Matsen MRN: [id number] Date of Birth: 04-08-1956 Referring Provider:   Doristine Devoid Pulmonary Rehab Walk Test from 01/05/2023 in [facility], Vascular, & Lung Health  Referring Provider [name]       Encounter Date: 02/19/2023  Check In:  Session Check In - 02/19/23 1515       Check-In   Supervising physician immediately available to respond to emergencies [facility] MD immediately available    Physician(s) Eligha Bridegroom, NP    [facility]    Staff Present Essie Hart, RN, BSN;Casey Katrinka Blazing, RT;Randi [place] BS, ACSM-CEP, Exercise Physiologist;Samantha Belarus, [place], Rexene Agent, MS, ACSM-CEP, Exercise Physiologist    Virtual Visit No    Medication changes reported     No    Fall or balance concerns reported    No    Tobacco Cessation No Change    Warm-up and Cool-down Performed as group-led instruction    Resistance Training Performed Yes    VAD Patient? No    PAD/SET Patient? No      Pain Assessment   Currently in Pain? No/denies    Multiple Pain Sites No             Capillary Blood Glucose: No results found for this or any previous visit (from the past 24 hour(s)).    Social History   Tobacco Use  Smoking Status Every Day   Current packs/day: 1.00   Average packs/day: 1 pack/day for 46.7 years (46.7 ttl pk-yrs)   Types: Cigarettes   Start date: 06/25/1976  Smokeless Tobacco Never  Tobacco Comments   smokes 1 ppd    Goals Met:  Independence with exercise equipment Exercise tolerated well No report of concerns or symptoms today Strength training completed today  Goals Unmet:  Not Applicable  Comments: Service time is from 1327 to 1451  Dr. Mechele Collin is Medical Director for Pulmonary Rehab at [facility].

## 2023-02-24 ENCOUNTER — Other Ambulatory Visit: Payer: Self-pay | Admitting: *Deleted

## 2023-02-24 ENCOUNTER — Encounter (HOSPITAL_COMMUNITY)
Admission: RE | Admit: 2023-02-24 | Discharge: 2023-02-24 | Disposition: A | Discharge status: Home / Self Care | Payer: Medicare Other | Source: Ambulatory Visit | Attending: Pulmonary Disease

## 2023-02-24 VITALS — Wt 188.9 lb

## 2023-02-24 DIAGNOSIS — F17210 Nicotine dependence, cigarettes, uncomplicated: Secondary | ICD-10-CM | POA: Diagnosis not present

## 2023-02-24 DIAGNOSIS — J449 Chronic obstructive pulmonary disease, unspecified: Secondary | ICD-10-CM | POA: Diagnosis not present

## 2023-02-24 MED ORDER — ROSUVASTATIN CALCIUM 40 MG PO TABS: 40.0000 mg | ORAL_TABLET | Freq: Every day | ORAL | 0 refills | Status: DC

## 2023-02-24 NOTE — Progress Notes (Signed)
Daily Session Note  Patient Details  Name: Brad Massey MRN: [id number] Date of Birth: 1955/12/09 Referring Provider:   Doristine Devoid Pulmonary Rehab Walk Test from 01/05/2023 in [facility]  Referring Provider [name]       Encounter Date: 02/24/2023  Check In:  Session Check In - 02/24/23 1428       Check-In   Supervising physician immediately available to respond to emergencies [facility] MD immediately available    Physician(s) Robin Searing, NP    Location [facility]    Staff Present Essie Hart, RN, BSN;Casey Katrinka Blazing, RT;Samantha Belarus, RD, Rexene Agent, MS, ACSM-CEP, Exercise Physiologist;Johnny Hale Bogus, MS, Exercise Physiologist    Virtual Visit No    Medication changes reported     No    Fall or balance concerns reported    No    Tobacco Cessation No Change    Warm-up and Cool-down Performed as group-led instruction    Resistance Training Performed Yes    VAD Patient? No    PAD/SET Patient? No      Pain Assessment   Currently in Pain? No/denies    Multiple Pain Sites No             Capillary Blood Glucose: No results found for this or any previous visit (from the past 24 hour(s)).   Exercise Prescription Changes - 02/24/23 1500       Response to Exercise   Blood Pressure (Admit) 114/76    Blood Pressure (Exercise) 154/74    Blood Pressure (Exit) 118/58    Heart Rate (Admit) 70 bpm    Heart Rate (Exercise) 90 bpm    Heart Rate (Exit) 73 bpm    Oxygen Saturation (Admit) 95 %    Oxygen Saturation (Exercise) 93 %    Oxygen Saturation (Exit) 94 %    Rating of Perceived Exertion (Exercise) 12    Perceived Dyspnea (Exercise) 1    Duration Continue with 30 min of aerobic exercise without signs/symptoms of physical distress.    Intensity THRR unchanged      Progression   Progression Continue to progress workloads to maintain intensity without signs/symptoms of physical distress.       Resistance Training   Training Prescription Yes    Weight black bands    Reps 10-15    Time 10 Minutes      Treadmill   MPH 2.5    Grade 2    Minutes 15    METs 3.6      Elliptical   Level 3    Speed 3    Minutes 15    METs 4.4             Social History   Tobacco Use  Smoking Status Every Day   Current packs/day: 1.00   Average packs/day: 1 pack/day for 46.7 years (46.7 ttl pk-yrs)   Types: Cigarettes   Start date: 06/25/1976  Smokeless Tobacco Never  Tobacco Comments   smokes 1 ppd    Goals Met:  Independence with exercise equipment Exercise tolerated well No report of concerns or symptoms today Strength training completed today  Goals Unmet:  Not Applicable  Comments: Service time is from 1320 to 1446    Dr. Mechele Collin is Medical Director for Pulmonary Rehab at [facility].

## 2023-02-25 NOTE — Progress Notes (Signed)
Pulmonary Individual Treatment Plan  Patient Details  Name: Brad Massey MRN: [id number] Date of Birth: [birth year] Referring Provider:   Doristine Devoid Pulmonary Rehab Walk Test from 01/05/2023 in [facility] [facility]  Referring Provider [name]       Initial Encounter Date:  Flowsheet Row Pulmonary Rehab Walk Test from 01/05/2023 in [facility] for Heart, Vascular, & Lung Health  Date 01/05/23       Visit Diagnosis: Stage 3 severe COPD by GOLD classification (HCC)  Patient's Home Medications on Admission:   Current Outpatient Medications:    albuterol (VENTOLIN HFA) 108 (90 Base) MCG/ACT inhaler, Inhale into the lungs every 6 (six) hours as needed for wheezing or shortness of breath., Disp: , Rfl:    aspirin EC 81 MG tablet, Take 1 tablet (81 mg total) by mouth daily. Swallow whole., Disp: 90 tablet, Rfl: 3   Budeson-Glycopyrrol-Formoterol (BREZTRI AEROSPHERE) 160-9-4.8 MCG/ACT AERO, Inhale 2 puffs into the lungs in the morning and at bedtime., Disp: 10.7 g, Rfl: 5   nitroGLYCERIN (NITROSTAT) 0.4 MG SL tablet, Place 1 tablet (0.4 mg total) under the tongue every 5 (five) minutes as needed for chest pain., Disp: 25 tablet, Rfl: 3   rosuvastatin (CRESTOR) 40 MG tablet, Take 1 tablet (40 mg total) by mouth daily., Disp: 30 tablet, Rfl: 0   verapamil (CALAN) 120 MG tablet, Take 120 mg by mouth 3 (three) times daily., Disp: , Rfl:   Current Facility-Administered Medications:    0.9 %  sodium chloride infusion, 500 mL, Intravenous, Continuous, Armbruster, Willaim Rayas, MD  Past Medical History: Past Medical History:  Diagnosis Date   Cluster headache    COPD (chronic obstructive pulmonary disease) (HCC)    Coronary artery disease    DVT (deep venous thrombosis) (HCC)    Heart murmur    Hypertension     Tobacco Use: Social History   Tobacco Use  Smoking Status Every Day   Current packs/day: 1.00   Average  packs/day: 1 pack/day for 46.7 years (46.7 ttl pk-yrs)   Types: Cigarettes   Start date: 06/25/1976  Smokeless Tobacco Never  Tobacco Comments   smokes 1 ppd    Labs: Review Flowsheet       Latest Ref Rng & Units 04/12/2020  Labs for ITP Cardiac and Pulmonary Rehab  Cholestrol 100 - 199 mg/dL 355   LDL (calc) 0 - 99 mg/dL 57   HDL-C >73 mg/dL 46   Trlycerides 0 - 220 mg/dL 66     Details            Capillary Blood Glucose: No results found for: "GLUCAP"   Pulmonary Assessment Scores:  Pulmonary Assessment Scores     Row Name 01/05/23 1153         ADL UCSD   ADL Phase Entry     SOB Score total 18       CAT Score   CAT Score 12       mMRC Score   mMRC Score 2             UCSD: Self-administered rating of dyspnea associated with activities of daily living (ADLs) 6-point scale (0 = "not at all" to 5 = "maximal or unable to do because of breathlessness")  Scoring Scores range from 0 to 120.  Minimally important difference is 5 units  CAT: CAT can identify the health impairment of COPD patients and is better correlated with disease progression.  CAT has a scoring range of zero to 40. The CAT score is classified into four groups of low (less than 10), medium (10 - 20), high (21-30) and very high (31-40) based on the impact level of disease on health status. A CAT score over 10 suggests significant symptoms.  A worsening CAT score could be explained by an exacerbation, poor medication adherence, poor inhaler technique, or progression of COPD or comorbid conditions.  CAT MCID is 2 points  mMRC: mMRC (Modified Medical Research Council) Dyspnea Scale is used to assess the degree of baseline functional disability in patients of respiratory disease due to dyspnea. No minimal important difference is established. A decrease in score of 1 point or greater is considered a positive change.   Pulmonary Function Assessment:  Pulmonary Function Assessment - 01/05/23 1121        Breath   Bilateral Breath Sounds Clear    Shortness of Breath Yes;Limiting activity             Exercise Target Goals: Exercise Program Goal: Individual exercise prescription set using results from initial 6 min walk test and THRR while considering  patient's activity barriers and safety.   Exercise Prescription Goal: Initial exercise prescription builds to 30-45 minutes a day of aerobic activity, 2-3 days per week.  Home exercise guidelines will be given to patient during program as part of exercise prescription that the participant will acknowledge.  Activity Barriers & Risk Stratification:  Activity Barriers & Cardiac Risk Stratification - 01/05/23 1117       Activity Barriers & Cardiac Risk Stratification   Activity Barriers Deconditioning;Muscular Weakness;Shortness of Breath    Cardiac Risk Stratification Low             6 Minute Walk:  6 Minute Walk     Row Name 01/05/23 1225         6 Minute Walk   Phase Initial     Distance 1382 feet     Walk Time 6 minutes     # of Rest Breaks 0     MPH 2.62     METS 3.31     RPE 8     Perceived Dyspnea  0     VO2 Peak 11.57     Symptoms No     Resting HR 64 bpm     Resting BP 118/70     Resting Oxygen Saturation  95 %     Exercise Oxygen Saturation  during 6 min walk 94 %     Max Ex. HR 88 bpm     Max Ex. BP 120/70     2 Minute Post BP 112/70       Interval HR   1 Minute HR 85     2 Minute HR 88     3 Minute HR 80     4 Minute HR 82     5 Minute HR 79     6 Minute HR 79     2 Minute Post HR 64     Interval Heart Rate? Yes       Interval Oxygen   Interval Oxygen? Yes     Baseline Oxygen Saturation % 95 %     1 Minute Oxygen Saturation % 96 %     1 Minute Liters of Oxygen 0 L     2 Minute Oxygen Saturation % 91 %     2 Minute Liters of Oxygen 0 L     3 Minute  Oxygen Saturation % 95 %     3 Minute Liters of Oxygen 0 L     4 Minute Oxygen Saturation % 96 %     4 Minute Liters of Oxygen 0 L      5 Minute Oxygen Saturation % 95 %     5 Minute Liters of Oxygen 0 L     6 Minute Oxygen Saturation % 96 %     6 Minute Liters of Oxygen 0 L     2 Minute Post Oxygen Saturation % 96 %     2 Minute Post Liters of Oxygen 0 L              Oxygen Initial Assessment:  Oxygen Initial Assessment - 01/05/23 1119       Home Oxygen   Home Oxygen Device None    Sleep Oxygen Prescription None    Home Exercise Oxygen Prescription None    Home Resting Oxygen Prescription None      Initial 6 min Walk   Oxygen Used None      Program Oxygen Prescription   Program Oxygen Prescription None      Intervention   Short Term Goals To learn and understand importance of maintaining oxygen saturations>88%;To learn and demonstrate proper use of respiratory medications;To learn and understand importance of monitoring SPO2 with pulse oximeter and demonstrate accurate use of the pulse oximeter.;To learn and demonstrate proper pursed lip breathing techniques or other breathing techniques.     Long  Term Goals Maintenance of O2 saturations>88%;Compliance with respiratory medication;Verbalizes importance of monitoring SPO2 with pulse oximeter and return demonstration;Exhibits proper breathing techniques, such as pursed lip breathing or other method taught during program session;Demonstrates proper use of MDI's             Oxygen Re-Evaluation:  Oxygen Re-Evaluation     Row Name 01/20/23 0852 02/20/23 1132           Program Oxygen Prescription   Program Oxygen Prescription None None        Home Oxygen   Home Oxygen Device None None      Sleep Oxygen Prescription None None      Home Exercise Oxygen Prescription None None      Home Resting Oxygen Prescription None None        Goals/Expected Outcomes   Short Term Goals To learn and understand importance of maintaining oxygen saturations>88%;To learn and demonstrate proper use of respiratory medications;To learn and understand importance of  monitoring SPO2 with pulse oximeter and demonstrate accurate use of the pulse oximeter.;To learn and demonstrate proper pursed lip breathing techniques or other breathing techniques.  To learn and understand importance of maintaining oxygen saturations>88%;To learn and demonstrate proper use of respiratory medications;To learn and understand importance of monitoring SPO2 with pulse oximeter and demonstrate accurate use of the pulse oximeter.;To learn and demonstrate proper pursed lip breathing techniques or other breathing techniques.       Long  Term Goals Maintenance of O2 saturations>88%;Compliance with respiratory medication;Verbalizes importance of monitoring SPO2 with pulse oximeter and return demonstration;Exhibits proper breathing techniques, such as pursed lip breathing or other method taught during program session;Demonstrates proper use of MDI's Maintenance of O2 saturations>88%;Compliance with respiratory medication;Verbalizes importance of monitoring SPO2 with pulse oximeter and return demonstration;Exhibits proper breathing techniques, such as pursed lip breathing or other method taught during program session;Demonstrates proper use of MDI's      Goals/Expected Outcomes Compliance and understanding of oxygen saturation monitoring and breathing  techniques to decrease shortness of breath. Compliance and understanding of oxygen saturation monitoring and breathing techniques to decrease shortness of breath.               Oxygen Discharge (Final Oxygen Re-Evaluation):  Oxygen Re-Evaluation - 02/20/23 1132       Program Oxygen Prescription   Program Oxygen Prescription None      Home Oxygen   Home Oxygen Device None    Sleep Oxygen Prescription None    Home Exercise Oxygen Prescription None    Home Resting Oxygen Prescription None      Goals/Expected Outcomes   Short Term Goals To learn and understand importance of maintaining oxygen saturations>88%;To learn and demonstrate proper use  of respiratory medications;To learn and understand importance of monitoring SPO2 with pulse oximeter and demonstrate accurate use of the pulse oximeter.;To learn and demonstrate proper pursed lip breathing techniques or other breathing techniques.     Long  Term Goals Maintenance of O2 saturations>88%;Compliance with respiratory medication;Verbalizes importance of monitoring SPO2 with pulse oximeter and return demonstration;Exhibits proper breathing techniques, such as pursed lip breathing or other method taught during program session;Demonstrates proper use of MDI's    Goals/Expected Outcomes Compliance and understanding of oxygen saturation monitoring and breathing techniques to decrease shortness of breath.             Initial Exercise Prescription:  Initial Exercise Prescription - 01/05/23 1200       Date of Initial Exercise RX and Referring Provider   Date 01/05/23    Referring Provider [name]    Expected Discharge Date 04/02/23      Treadmill   MPH 2.5    Grade 0    Minutes 15      Elliptical   Level 1    Speed 1    Minutes 15      Prescription Details   Frequency (times per week) 2    Duration Progress to 30 minutes of continuous aerobic without signs/symptoms of physical distress      Intensity   THRR 40-80% of Max Heartrate 62-124    Ratings of Perceived Exertion 11-13    Perceived Dyspnea 0-4      Progression   Progression Continue to progress workloads to maintain intensity without signs/symptoms of physical distress.      Resistance Training   Training Prescription Yes    Weight blue bands    Reps 10-15             Perform Capillary Blood Glucose checks as needed.  Exercise Prescription Changes:   Exercise Prescription Changes     Row Name 01/13/23 1500 01/27/23 1400 02/10/23 1500 02/17/23 1500 02/24/23 1500     Response to Exercise   Blood Pressure (Admit) 140/72 126/76 126/74 -- 114/76   Blood Pressure (Exercise) 154/80 166/100 142/58 --  154/74   Blood Pressure (Exit) 118/72 138/72 138/80 -- 118/58   Heart Rate (Admit) 83 bpm 58 bpm 59 bpm -- 70 bpm   Heart Rate (Exercise) 107 bpm 84 bpm 89 bpm -- 90 bpm   Heart Rate (Exit) 85 bpm 61 bpm 65 bpm -- 73 bpm   Oxygen Saturation (Admit) 95 % 93 % 94 % -- 95 %   Oxygen Saturation (Exercise) 93 % 91 % 93 % -- 93 %   Oxygen Saturation (Exit) 95 % 94 % 93 % -- 94 %   Rating of Perceived Exertion (Exercise) 11 14 13  -- 12   Perceived Dyspnea (Exercise) 1  3 2 -- 1   Duration Progress to 30 minutes of  aerobic without signs/symptoms of physical distress Continue with 30 min of aerobic exercise without signs/symptoms of physical distress. Continue with 30 min of aerobic exercise without signs/symptoms of physical distress. -- Continue with 30 min of aerobic exercise without signs/symptoms of physical distress.   Intensity THRR unchanged THRR unchanged THRR unchanged -- THRR unchanged     Progression   Progression Continue to progress workloads to maintain intensity without signs/symptoms of physical distress. Continue to progress workloads to maintain intensity without signs/symptoms of physical distress. Continue to progress workloads to maintain intensity without signs/symptoms of physical distress. -- Continue to progress workloads to maintain intensity without signs/symptoms of physical distress.   Average METs -- 4 -- -- --     Paramedic Prescription Yes Yes Yes -- Yes   Weight black bands black bands black bands -- black bands   Reps 10-15 10-15 10-15 -- 10-15   Time 10 Minutes 10 Minutes 10 Minutes -- 10 Minutes     Treadmill   MPH 2 2.5 2.5 -- 2.5   Grade 0 1 1 -- 2   Minutes 15 15 15  -- 15   METs 2.53 3.26 3.26 -- 3.6     Elliptical   Level 1 2 2  -- 3   Speed 1 2 2  -- 3   Minutes 15 15 15  -- 15   METs 4 4 4.2 -- 4.4     Home Exercise Plan   Plans to continue exercise at -- -- -- Home (comment)  walking --   Frequency -- -- -- Add 1 additional day  to program exercise sessions. --   Initial Home Exercises Provided -- -- -- 02/17/23 --            Exercise Comments:   Exercise Comments     Row Name 01/13/23 1524 02/17/23 1504         Exercise Comments Pt completed his first day of group exercise. He exercised on the upright elliptical for 15 min, level 1, incline 1, METs 4.0. He then walked on the treadmill for 15 min, speed 2.0, incline 0, METs 2.53. Pt tolerated well without c/o. Performed warm up and cool down with verbal cues. Discussed METs with good reception. Discussed with pt home exercise plan. He is currently walking 30+ min occasionally with his wife. Discussed walking 1-3 non rehab days 30 min or more. He also is a member of [employer] and I encouraged him to do similar machines there if he would like. He wants to be able to hike with his wife.               Exercise Goals and Review:   Exercise Goals     Row Name 01/05/23 1117             Exercise Goals   Increase Physical Activity Yes       Intervention Provide advice, education, support and counseling about physical activity/exercise needs.;Develop an individualized exercise prescription for aerobic and resistive training based on initial evaluation findings, risk stratification, comorbidities and participant's personal goals.       Expected Outcomes Short Term: Attend rehab on a regular basis to increase amount of physical activity.;Long Term: Exercising regularly at least 3-5 days a week.;Long Term: Add in home exercise to make exercise part of routine and to increase amount of physical activity.       Increase Strength and  Stamina Yes       Intervention Provide advice, education, support and counseling about physical activity/exercise needs.;Develop an individualized exercise prescription for aerobic and resistive training based on initial evaluation findings, risk stratification, comorbidities and participant's personal goals.       Expected Outcomes  Short Term: Increase workloads from initial exercise prescription for resistance, speed, and METs.;Short Term: Perform resistance training exercises routinely during rehab and add in resistance training at home;Long Term: Improve cardiorespiratory fitness, muscular endurance and strength as measured by increased METs and functional capacity ( )       Able to understand and use rate of perceived exertion (RPE) scale Yes       Intervention Provide education and explanation on how to use RPE scale       Expected Outcomes Short Term: Able to use RPE daily in rehab to express subjective intensity level;Long Term:  Able to use RPE to guide intensity level when exercising independently       Able to understand and use Dyspnea scale Yes       Intervention Provide education and explanation on how to use Dyspnea scale       Expected Outcomes Short Term: Able to use Dyspnea scale daily in rehab to express subjective sense of shortness of breath during exertion;Long Term: Able to use Dyspnea scale to guide intensity level when exercising independently       Knowledge and understanding of Target Heart Rate Range (THRR) Yes       Intervention Provide education and explanation of THRR including how the numbers were predicted and where they are located for reference       Expected Outcomes Short Term: Able to state/look up THRR;Long Term: Able to use THRR to govern intensity when exercising independently;Short Term: Able to use daily as guideline for intensity in rehab       Understanding of Exercise Prescription Yes       Intervention Provide education, explanation, and written materials on patient's individual exercise prescription       Expected Outcomes Short Term: Able to explain program exercise prescription;Long Term: Able to explain home exercise prescription to exercise independently                Exercise Goals Re-Evaluation :  Exercise Goals Re-Evaluation     Row Name 01/20/23 0849 02/20/23  1130           Exercise Goal Re-Evaluation   Exercise Goals Review Increase Physical Activity;Able to understand and use Dyspnea scale;Understanding of Exercise Prescription;Increase Strength and Stamina;Knowledge and understanding of Target Heart Rate Range (THRR);Able to understand and use rate of perceived exertion (RPE) scale Increase Physical Activity;Able to understand and use Dyspnea scale;Understanding of Exercise Prescription;Increase Strength and Stamina;Knowledge and understanding of Target Heart Rate Range (THRR);Able to understand and use rate of perceived exertion (RPE) scale      Comments Pt has completed two days of group exercise. He will miss today for a vacation. He is exercising on the upright elliptical for 15 min, level 1, incline 1, METs 4.0. He then is walking on the treadmill for 15 min, speed 2.0, incline 0, METs 2.53. Plan to increase his workload next session as he is tolerating exercise well. He performs warm up and cool down without limitations. Pt has completed 9 days of group exercise, missing 3 sessions for vacation and illness. He is exercising on the upright elliptical for 15 min, level 3, incline 3, METs 4.4. He then is walking on the  treadmill for 15 min, speed 2.5, incline 2, METs 3.4. He is progressing and motivated. He has goals to be able to hike well. He is using black bands.      Expected Outcomes Through exercise at rehab and home, the patient will decrease shortness of breath with daily activities and feel confident in carrying out an exercise regimen at home Through exercise at rehab and home, the patient will decrease shortness of breath with daily activities and feel confident in carrying out an exercise regimen at home               Discharge Exercise Prescription (Final Exercise Prescription Changes):  Exercise Prescription Changes - 02/24/23 1500       Response to Exercise   Blood Pressure (Admit) 114/76    Blood Pressure (Exercise) 154/74     Blood Pressure (Exit) 118/58    Heart Rate (Admit) 70 bpm    Heart Rate (Exercise) 90 bpm    Heart Rate (Exit) 73 bpm    Oxygen Saturation (Admit) 95 %    Oxygen Saturation (Exercise) 93 %    Oxygen Saturation (Exit) 94 %    Rating of Perceived Exertion (Exercise) 12    Perceived Dyspnea (Exercise) 1    Duration Continue with 30 min of aerobic exercise without signs/symptoms of physical distress.    Intensity THRR unchanged      Progression   Progression Continue to progress workloads to maintain intensity without signs/symptoms of physical distress.      Resistance Training   Training Prescription Yes    Weight black bands    Reps 10-15    Time 10 Minutes      Treadmill   MPH 2.5    Grade 2    Minutes 15    METs 3.6      Elliptical   Level 3    Speed 3    Minutes 15    METs 4.4             Nutrition:  Target Goals: Understanding of nutrition guidelines, daily intake of sodium 1500mg , cholesterol 200mg , calories 30% from fat and 7% or less from saturated fats, daily to have 5 or more servings of fruits and vegetables.  Biometrics:  Pre Biometrics - 01/05/23 1258       Pre Biometrics   Grip Strength 40 kg              Nutrition Therapy Plan and Nutrition Goals:  Nutrition Therapy & Goals - 02/10/23 1444       Nutrition Therapy   Diet General Healthy Diet    Drug/Food Interactions Statins/Certain Fruits      Personal Nutrition Goals   Nutrition Goal Patient to improve diet quality by using the plate method as a guide for meal planning to include lean protein/plant protein, fruits, vegetables, whole grains, nonfat dairy as part of a well-balanced diet.   goal in progress.   Comments Pryor reports no nutrition concerns/questions at this time. He reports eating a wide variety of foods with increased focus on fiber intake related to recent hemrroid surgery. 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 He reports normal bowel movements with consistent dietary fiber intake. He remains pre-contemplative toward smoking cessation; counseled by staff and resouces given on 9/26. He is up 3.3# since starting with our program. Patient will benefit from participation in intensive cardiac rehab for nutrition, exercise, and lifestyle modification.  Intervention Plan   Intervention Prescribe, educate and counsel regarding individualized specific dietary modifications aiming towards targeted core components such as weight, hypertension, lipid management, diabetes, heart failure and other comorbidities.;Nutrition handout(s) given to patient.    Expected Outcomes Short Term Goal: Understand basic principles of dietary content, such as calories, fat, sodium, cholesterol and nutrients.;Long Term Goal: Adherence to prescribed nutrition plan.             Nutrition Assessments:  Nutrition Assessments - 01/13/23 1510       Rate Your Plate Scores   Pre Score 48            MEDIFICTS Score Key: >=70 Need to make dietary changes  40-70 Heart Healthy Diet <= 40 Therapeutic Level Cholesterol Diet  Flowsheet Row PULMONARY REHAB CHRONIC OBSTRUCTIVE PULMONARY DISEASE from 01/13/2023 in [facility], V[facility], & Lung Health  Picture Your Plate Total Score on Admission 48      Picture Your Plate Scores: <29 Unhealthy dietary pattern with much room for improvement. 41-50 Dietary pattern unlikely to meet recommendations for good health and room for improvement. 51-60 More healthful dietary pattern, with some room for improvement.  >60 Healthy dietary pattern, although there may be some specific behaviors that could be improved.    Nutrition Goals Re-Evaluation:  Nutrition Goals Re-Evaluation     Row Name 01/13/23 1406 02/10/23 1444           Goals   Current Weight 185 lb 13.6 oz (84.3 kg) 188 lb 0.8 oz (85.3 kg)      Comment labs not  available from outside provider [facility]. Per Patient labs WNL labs not available from outside provider [facility]. Per Patient labs WNL      Expected Outcome Parmvir reports no nutrition concerns/questions at this time. He reports eating a wide variety of foods with increased focus on fiber intake related to recent hemrroid surgery. 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 He reports normal bowel movements with consistent dietary fiber intake. He remains pre-contemplative toward smoking cessation. Patient will benefit from participation in intensive cardiac rehab for nutrition, exercise, and lifestyle modification. [place] reports no nutrition concerns/questions at this time. He reports eating a wide variety of foods with increased focus on fiber intake related to recent hemrroid surgery. 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 He reports normal bowel movements with consistent dietary fiber intake. He remains pre-contemplative toward smoking cessation; counseled by staff and resouces given on 9/26. He is up 3.3# since starting with our program. Patient will benefit from participation in intensive cardiac rehab for nutrition, exercise, and lifestyle modification.               Nutrition Goals Discharge (Final Nutrition Goals Re-Evaluation):  Nutrition Goals Re-Evaluation - 02/10/23 1444       Goals   Current Weight 188 lb 0.8 oz (85.3 kg)    Comment labs not available from outside provider [facility]. Per Patient labs WNL    Expected Outcome Corderro reports no nutrition concerns/questions at this time. He reports eating a wide variety of foods with increased focus on fiber intake related to recent hemrroid surgery. 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 He reports normal bowel movements with consistent dietary fiber intake. He remains pre-contemplative toward smoking cessation; counseled  by staff and resouces given on 9/26. He is up 3.3# since starting with our program. Patient will benefit from participation in intensive cardiac rehab for nutrition, exercise, and lifestyle modification.             Psychosocial: Target Goals: Acknowledge presence or absence of significant depression and/or stress, maximize coping skills, provide positive support system. Participant is able to verbalize types and ability to use techniques and skills needed for reducing stress and depression.  Initial Review & Psychosocial Screening:  Initial Psych Review & Screening - 01/05/23 1112       Initial Review   Current issues with None Identified      Family Dynamics   Good Support System? Yes      Barriers   Psychosocial barriers to participate in program There are no identifiable barriers or psychosocial needs.      Screening Interventions   Interventions Encouraged to exercise             Quality of Life Scores:  Scores of 19 and below usually indicate a poorer quality of life in these areas.  A difference of  2-3 points is a clinically meaningful difference.  A difference of 2-3 points in the total score of the Quality of Life Index has been associated with significant improvement in overall quality of life, self-image, physical symptoms, and general health in studies assessing change in quality of life.  PHQ-9: Review Flowsheet       01/05/2023  Depression screen PHQ 2/9  Decreased Interest 0  Down, Depressed, Hopeless 0  PHQ - 2 Score 0  Altered sleeping 1  Tired, decreased energy 1  Change in appetite 0  Feeling bad or failure about yourself  0  Trouble concentrating 0  Moving slowly or fidgety/restless 0  Suicidal thoughts 0  PHQ-9 Score 2  Difficult doing work/chores Not difficult at all    Details            Interpretation of Total Score  Total Score Depression Severity:  1-4 = Minimal depression, 5-9 = Mild depression, 10-14 = Moderate depression, 15-19 = Moderately severe depression, 20-27 = Severe depression   Psychosocial Evaluation and Intervention:  Psychosocial Evaluation - 01/05/23 1113       Psychosocial Evaluation & Interventions   Interventions Encouraged to exercise with the program and follow exercise prescription    Comments Olie denies any psychosocial barriers or concerns at this time.    Expected Outcomes For Hayden to participate in PR free of any psychosocial barriers or concerns    Continue Psychosocial Services  No Follow up required             Psychosocial Re-Evaluation:  Psychosocial Re-Evaluation     Row Name 01/21/23 1047 02/20/23 1456           Psychosocial Re-Evaluation   Current issues with None Identified None Identified      Comments Jarin denies any psychosocial issues or problems at time of eval. He started the program on 9/17 and has attended 2 classes so far. At time of reevaluation Fitzgerald still denies any psychosocial needs or barriers. He states he has good support from his wife and enjoys camping for stress relief.      Expected Outcomes For Xavier to continue to attend PR without any psychosocial barriers or concerns For Amaru to continue to attend PR without any psychosocial barriers or concerns      Interventions -- Encouraged to attend Pulmonary Rehabilitation for the  exercise      Continue Psychosocial Services  No Follow up required No Follow up required               Psychosocial Discharge (Final Psychosocial Re-Evaluation):  Psychosocial Re-Evaluation - 02/20/23 1456       Psychosocial Re-Evaluation   Current issues with None Identified    Comments At time of reevaluation Jordin still denies any psychosocial needs or barriers. He states he has good support from his wife and enjoys camping for stress relief.    Expected Outcomes For  Vincen to continue to attend PR without any psychosocial barriers or concerns    Interventions Encouraged to attend Pulmonary Rehabilitation for the exercise    Continue Psychosocial Services  No Follow up required             Education: Education Goals: Education classes will be provided on a weekly basis, covering required topics. Participant will state understanding/return demonstration of topics presented.  Learning Barriers/Preferences:  Learning Barriers/Preferences - 01/05/23 1114       Learning Barriers/Preferences   Learning Barriers Sight   wears glasses, needs cataract surgery   Learning Preferences Group Instruction;Individual Instruction;Skilled Demonstration             Education Topics: Know Your Numbers Group instruction that is supported by a PowerPoint presentation. Instructor discusses importance of knowing and understanding resting, exercise, and post-exercise oxygen saturation, heart rate, and blood pressure. Oxygen saturation, heart rate, blood pressure, rating of perceived exertion, and dyspnea are reviewed along with a normal range for these values.  Flowsheet Row PULMONARY REHAB CHRONIC OBSTRUCTIVE PULMONARY DISEASE from 01/29/2023 in [facility] for Heart, Vascular, & Lung Health  Date 01/29/23  Educator EP  Instruction Review Code 1- Verbalizes Understanding       Exercise for the Pulmonary Patient Group instruction that is supported by a PowerPoint presentation. Instructor discusses benefits of exercise, core components of exercise, frequency, duration, and intensity of an exercise routine, importance of utilizing pulse oximetry during exercise, safety while exercising, and options of places to exercise outside of rehab.  Flowsheet Row PULMONARY REHAB CHRONIC OBSTRUCTIVE PULMONARY DISEASE from 01/22/2023 in [facility] for Heart, Vascular, & Lung Health  Date 01/22/23  Educator EP  Instruction Review  Code 1- Verbalizes Understanding       MET Level  Group instruction provided by PowerPoint, verbal discussion, and written material to support subject matter. Instructor reviews what METs are and how to increase METs.    Pulmonary Medications Verbally interactive group education provided by instructor with focus on inhaled medications and proper administration. Flowsheet Row PULMONARY REHAB CHRONIC OBSTRUCTIVE PULMONARY DISEASE from 01/15/2023 in [facility] for Heart, Vascular, & Lung Health  Date 01/15/23  Educator RT  Instruction Review Code 1- Verbalizes Understanding       Anatomy and Physiology of the Respiratory System Group instruction provided by PowerPoint, verbal discussion, and written material to support subject matter. Instructor reviews respiratory cycle and anatomical components of the respiratory system and their functions. Instructor also reviews differences in obstructive and restrictive respiratory diseases with examples of each.    Oxygen Safety Group instruction provided by PowerPoint, verbal discussion, and written material to support subject matter. There is an overview of "What is Oxygen" and "Why do we need it".  Instructor also reviews how to create a safe environment for oxygen use, the importance of using oxygen as prescribed, and the risks of noncompliance. There  is a brief discussion on traveling with oxygen and resources the patient may utilize.   Oxygen Use Group instruction provided by PowerPoint, verbal discussion, and written material to discuss how supplemental oxygen is prescribed and different types of oxygen supply systems. Resources for more information are provided.  Flowsheet Row PULMONARY REHAB CHRONIC OBSTRUCTIVE PULMONARY DISEASE from 02/12/2023 in [facility] for Heart, Vascular, & Lung Health  Date 02/12/23  Educator RT  Instruction Review Code 1- Verbalizes Understanding        Breathing Techniques Group instruction that is supported by demonstration and informational handouts. Instructor discusses the benefits of pursed lip and diaphragmatic breathing and detailed demonstration on how to perform both.  Flowsheet Row PULMONARY REHAB CHRONIC OBSTRUCTIVE PULMONARY DISEASE from 02/19/2023 in [facility] for Heart, Vascular, & Lung Health  Date 02/19/23  Educator RN  Instruction Review Code 1- Verbalizes Understanding        Risk Factor Reduction Group instruction that is supported by a PowerPoint presentation. Instructor discusses the definition of a risk factor, different risk factors for pulmonary disease, and how the heart and lungs work together.   Pulmonary Diseases Group instruction provided by PowerPoint, verbal discussion, and written material to support subject matter. Instructor gives an overview of the different type of pulmonary diseases. There is also a discussion on risk factors and symptoms as well as ways to manage the diseases.   Stress and Energy Conservation Group instruction provided by PowerPoint, verbal discussion, and written material to support subject matter. Instructor gives an overview of stress and the impact it can have on the body. Instructor also reviews ways to reduce stress. There is also a discussion on energy conservation and ways to conserve energy throughout the day.   Warning Signs and Symptoms Group instruction provided by PowerPoint, verbal discussion, and written material to support subject matter. Instructor reviews warning signs and symptoms of stroke, heart attack, cold and flu. Instructor also reviews ways to prevent the spread of infection.   Other Education Group or individual verbal, written, or video instructions that support the educational goals of the pulmonary rehab program.    Knowledge Questionnaire Score:  Knowledge Questionnaire Score - 01/05/23 1153       Knowledge  Questionnaire Score   Pre Score 14/18             Core Components/Risk Factors/Patient Goals at Admission:  Personal Goals and Risk Factors at Admission - 01/05/23 1115       Core Components/Risk Factors/Patient Goals on Admission    Weight Management Weight Loss;Yes    Intervention Weight Management: Develop a combined nutrition and exercise program designed to reach desired caloric intake, while maintaining appropriate intake of nutrient and fiber, sodium and fats, and appropriate energy expenditure required for the weight goal.;Weight Management: Provide education and appropriate resources to help participant work on and attain dietary goals.;Weight Management/Obesity: Establish reasonable short term and long term weight goals.;Obesity: Provide education and appropriate resources to help participant work on and attain dietary goals.    Expected Outcomes Short Term: Continue to assess and modify interventions until short term weight is achieved;Long Term: Adherence to nutrition and physical activity/exercise program aimed toward attainment of established weight goal;Weight Loss: Understanding of general recommendations for a balanced deficit meal plan, which promotes 1-2 lb weight loss per week and includes a negative energy balance of [id number] kcal/d;Understanding recommendations for meals to include 15-35% energy as protein, 25-35% energy from fat, 35-60% energy from  carbohydrates, less than 200mg  of dietary cholesterol, 20-35 gm of total fiber daily;Understanding of distribution of calorie intake throughout the day with the consumption of 4-5 meals/snacks    Tobacco Cessation Yes    Number of packs per day 1ppd    Intervention Assist the participant in steps to quit. Provide individualized education and counseling about committing to Tobacco Cessation, relapse prevention, and pharmacological support that can be provided by physician.;Education officer, environmental, assist with locating and  accessing local/national Quit Smoking programs, and support quit date choice.    Expected Outcomes Short Term: Will demonstrate readiness to quit, by selecting a quit date.;Short Term: Will quit all tobacco product use, adhering to prevention of relapse plan.;Long Term: Complete abstinence from all tobacco products for at least 12 months from quit date.    Improve shortness of breath with ADL's Yes    Intervention Provide education, individualized exercise plan and daily activity instruction to help decrease symptoms of SOB with activities of daily living.    Expected Outcomes Short Term: Improve cardiorespiratory fitness to achieve a reduction of symptoms when performing ADLs;Long Term: Be able to perform more ADLs without symptoms or delay the onset of symptoms    Increase knowledge of respiratory medications and ability to use respiratory devices properly  Yes    Intervention Provide education and demonstration as needed of appropriate use of medications, inhalers, and oxygen therapy.    Expected Outcomes Short Term: Achieves understanding of medications use. Understands that oxygen is a medication prescribed by physician. Demonstrates appropriate use of inhaler and oxygen therapy.;Long Term: Maintain appropriate use of medications, inhalers, and oxygen therapy.             Core Components/Risk Factors/Patient Goals Review:   Goals and Risk Factor Review     Row Name 01/21/23 1048 01/22/23 1540 02/17/23 1507 02/20/23 1500       Core Components/Risk Factors/Patient Goals Review   Personal Goals Review Weight Management/Obesity;Tobacco Cessation;Improve shortness of breath with ADL's;Develop more efficient breathing techniques such as purse lipped breathing and diaphragmatic breathing and practicing self-pacing with activity. Tobacco Cessation Tobacco Cessation Weight Management/Obesity;Tobacco Cessation;Improve shortness of breath with ADL's;Develop more efficient breathing techniques such as  purse lipped breathing and diaphragmatic breathing and practicing self-pacing with activity.    Review Unable to assess goals. Keil has completed 2 exercise sessions. Discussed with pt readiness for smoking cessation. He sts that he thinks about quitting daily but he has never actually made any actions steps to quit. Gave pt resources to view and consider the effects of smoking. Encouraged pt to consider action in quitting. He agreed. Will f/u. Spoke with pt again regarding his readiness to quit smoking. He has not looked at the resources yet and is still contemplative. Discussed with pt the risks of smoking regarding his health. He would like to be able to hike more and discussed smoking's impact on exertional ability. Highly encouraged pt to set a concrete quit date. He is not interested in Chantix but possibly nicotine patches. Pt discussed with a fellow pt as well. Goal not met for weight loss. Harace states he would like to lose weight but is up ~4# since starting. He has spoken to our dietician but states he has a good understanding of what he needs to do. Goal progressing for decreasing his shortness of breath with ADLs. Carry's oxygen has maintained >88% on room air with exertion. He has increased both his workload and METs while maintaining his oxygen saturations. Goal progressing on  developing more efficient breathing techniques such as purse lipped breathing and diaphragmatic breathing; and practicing self-pacing with activity. Brekken needs to be reminded to initiate PLB without staff interference while exercising. He knows how to listen to his body and pace himself when he becomes short of breath. Goal not met for tabacco cessation. Rodrecus is not ready to quit smoking at this time. Resources have been provided and staff have assisted in setting up an action plan and quit date. We will continue to monitor and follow along with [facility]. He will continue to benefit from participation in PR for nutrition, education,  exercise, and lifestyle modification.    Expected Outcomes For Chrishawn to lose weight, quit smoking, improve his shortness of breath with ADLs, and develop more efficient breathing techniques and learn how to self pace For pt to choose an action plan and set a quit date. For pt to choose an action plan and set a quit date. For Kemontae to lose weight, quit smoking, improve his shortness of breath with ADLs, and develop more efficient breathing techniques and learn how to self pace             Core Components/Risk Factors/Patient Goals at Discharge (Final Review):   Goals and Risk Factor Review - 02/20/23 1500       Core Components/Risk Factors/Patient Goals Review   Personal Goals Review Weight Management/Obesity;Tobacco Cessation;Improve shortness of breath with ADL's;Develop more efficient breathing techniques such as purse lipped breathing and diaphragmatic breathing and practicing self-pacing with activity.    Review Goal not met for weight loss. Vick states he would like to lose weight but is up ~4# since starting. He has spoken to our dietician but states he has a good understanding of what he needs to do. Goal progressing for decreasing his shortness of breath with ADLs. Carry's oxygen has maintained >88% on room air with exertion. He has increased both his workload and METs while maintaining his oxygen saturations. Goal progressing on developing more efficient breathing techniques such as purse lipped breathing and diaphragmatic breathing; and practicing self-pacing with activity. Aldric needs to be reminded to initiate PLB without staff interference while exercising. He knows how to listen to his body and pace himself when he becomes short of breath. Goal not met for tabacco cessation. Jeriel is not ready to quit smoking at this time. Resources have been provided and staff have assisted in setting up an action plan and quit date. We will continue to monitor and follow along with [facility]. He will continue  to benefit from participation in PR for nutrition, education, exercise, and lifestyle modification.    Expected Outcomes For Edmundo to lose weight, quit smoking, improve his shortness of breath with ADLs, and develop more efficient breathing techniques and learn how to self pace             ITP Comments:   Comments: Pt is making expected progress toward Pulmonary Rehab goals after completing 10 session(s). Recommend continued exercise, life style modification, education, and utilization of breathing techniques to increase stamina and strength, while also decreasing shortness of breath with exertion.  Dr. Mechele Collin is Medical Director for Pulmonary Rehab at [facility].

## 2023-02-26 ENCOUNTER — Encounter (HOSPITAL_COMMUNITY)
Admission: RE | Admit: 2023-02-26 | Discharge: 2023-02-26 | Disposition: A | Discharge status: Home / Self Care | Payer: Medicare Other | Source: Ambulatory Visit | Attending: Pulmonary Disease | Admitting: Pulmonary Disease

## 2023-02-26 DIAGNOSIS — J449 Chronic obstructive pulmonary disease, unspecified: Secondary | ICD-10-CM | POA: Diagnosis not present

## 2023-02-26 DIAGNOSIS — F17210 Nicotine dependence, cigarettes, uncomplicated: Secondary | ICD-10-CM | POA: Diagnosis not present

## 2023-02-26 NOTE — Progress Notes (Signed)
Daily Session Note  Patient Details  Name: Jovani Laber MRN: [id number] Date of Birth: [birth year] Referring Provider:   Doristine Devoid Pulmonary Rehab Walk Test from 01/05/2023 in [facility] for Heart, Vascular, & Lung Health  Referring Provider [name]       Encounter Date: 02/26/2023  Check In:  Session Check In - 02/26/23 1323       Check-In   Supervising physician immediately available to respond to emergencies [facility] MD immediately available    Physician(s) Carlyon Shadow, NP    Location [facility]    Staff Present Essie Hart, RN, BSN;Casey Katrinka Blazing, RT;Samantha Belarus, RD, Rexene Agent, MS, ACSM-CEP, Exercise Physiologist;Johnny Hale Bogus, MS, Exercise Physiologist    Virtual Visit No    Medication changes reported     No    Fall or balance concerns reported    No    Tobacco Cessation No Change    Warm-up and Cool-down Performed as group-led instruction    Resistance Training Performed Yes    VAD Patient? No    PAD/SET Patient? No      Pain Assessment   Currently in Pain? No/denies    Multiple Pain Sites No             Capillary Blood Glucose: No results found for this or any previous visit (from the past 24 hour(s)).    Social History   Tobacco Use  Smoking Status Every Day   Current packs/day: 1.00   Average packs/day: 1 pack/day for 46.7 years (46.7 ttl pk-yrs)   Types: Cigarettes   Start date: 06/25/1976  Smokeless Tobacco Never  Tobacco Comments   smokes 1 ppd    Goals Met:  Proper associated with RPD/PD & O2 Sat Independence with exercise equipment Exercise tolerated well No report of concerns or symptoms today Strength training completed today  Goals Unmet:  Not Applicable  Comments: Service time is from 1318 to 1414.    Dr. Mechele Collin is Medical Director for Pulmonary Rehab at [facility].

## 2023-03-03 ENCOUNTER — Encounter (HOSPITAL_COMMUNITY): Payer: Medicare Other

## 2023-03-05 ENCOUNTER — Encounter (HOSPITAL_COMMUNITY)
Admission: RE | Admit: 2023-03-05 | Discharge: 2023-03-05 | Disposition: A | Discharge status: Home / Self Care | Payer: Medicare Other | Source: Ambulatory Visit | Attending: Pulmonary Disease | Admitting: Pulmonary Disease

## 2023-03-05 DIAGNOSIS — F17210 Nicotine dependence, cigarettes, uncomplicated: Secondary | ICD-10-CM | POA: Insufficient documentation

## 2023-03-05 DIAGNOSIS — J449 Chronic obstructive pulmonary disease, unspecified: Secondary | ICD-10-CM | POA: Diagnosis not present

## 2023-03-05 NOTE — Progress Notes (Signed)
Daily Session Note  Patient Details  Name: Brad Massey MRN: [id number] Date of Birth: 07/11/1955 Referring Provider:   Doristine Devoid Pulmonary Rehab Walk Test from 01/05/2023 in [facility] for Heart, Vascular, & Lung Health  Referring Provider [name]       Encounter Date: 03/05/2023  Check In:  Session Check In - 03/05/23 1346       Check-In   Supervising physician immediately available to respond to emergencies [facility] MD immediately available    Physician(s) Robin Searing, NP    Location [facility]    Staff Present Essie Hart, RN, BSN;Casey Katrinka Blazing, Zella Richer, MS, ACSM-CEP, Exercise Physiologist;Randi [facility], ACSM-CEP, Exercise Physiologist    Virtual Visit No    Medication changes reported     No    Fall or balance concerns reported    No    Tobacco Cessation No Change    Warm-up and Cool-down Performed as group-led instruction    Resistance Training Performed Yes    VAD Patient? No    PAD/SET Patient? No      Pain Assessment   Currently in Pain? No/denies    Multiple Pain Sites No             Capillary Blood Glucose: No results found for this or any previous visit (from the past 24 hour(s)).    Social History   Tobacco Use  Smoking Status Every Day   Current packs/day: 1.00   Average packs/day: 1 pack/day for 46.7 years (46.7 ttl pk-yrs)   Types: Cigarettes   Start date: 06/25/1976  Smokeless Tobacco Never  Tobacco Comments   smokes 1 ppd    Goals Met:  Proper associated with RPD/PD & O2 Sat Independence with exercise equipment Exercise tolerated well No report of concerns or symptoms today Strength training completed today  Goals Unmet:  Not Applicable  Comments: Service time is from 1320 to 1438.    Dr. Mechele Collin is Medical Director for Pulmonary Rehab at [facility].

## 2023-03-06 ENCOUNTER — Ambulatory Visit: Payer: Medicare Other | Attending: Nurse Practitioner | Admitting: Nurse Practitioner

## 2023-03-06 ENCOUNTER — Encounter: Payer: Self-pay | Admitting: Nurse Practitioner

## 2023-03-06 VITALS — BP 132/88 | HR 71 | Ht 71.0 in | Wt 189.2 lb

## 2023-03-06 DIAGNOSIS — Z720 Tobacco use: Secondary | ICD-10-CM | POA: Diagnosis not present

## 2023-03-06 DIAGNOSIS — I10 Essential (primary) hypertension: Secondary | ICD-10-CM | POA: Diagnosis not present

## 2023-03-06 DIAGNOSIS — I2510 Atherosclerotic heart disease of native coronary artery without angina pectoris: Secondary | ICD-10-CM

## 2023-03-06 DIAGNOSIS — I77810 Thoracic aortic ectasia: Secondary | ICD-10-CM

## 2023-03-06 DIAGNOSIS — J449 Chronic obstructive pulmonary disease, unspecified: Secondary | ICD-10-CM

## 2023-03-06 DIAGNOSIS — E785 Hyperlipidemia, unspecified: Secondary | ICD-10-CM

## 2023-03-06 NOTE — Patient Instructions (Addendum)
Medication Instructions:  Your physician recommends that you continue on your current medications as directed. Please refer to the Current Medication list given to you today.  *If you need a refill on your cardiac medications before your next appointment, please call your pharmacy*   Lab Work: NONE ordered at this time of appointment    Testing/Procedures: Your physician has requested that you have cardiac CT. Cardiac computed tomography (CT) is a painless test that uses an x-ray machine to take clear, detailed pictures of your heart. For further information please visit [email or web address] Please follow instruction sheet as given. Cardiac CT Angiogram A cardiac CT angiogram is a procedure to look at the heart and the area around the heart. It may be done to help find the cause of chest pains or other symptoms of heart disease. During this procedure, a substance called contrast dye is injected into a vein in the arm. The contrast highlights the blood vessels in the area to be checked. A large X-ray machine (CT scanner), then takes detailed pictures of the heart and the surrounding area. The procedure is also sometimes called a coronary CT angiogram, coronary artery scanning, or CTA. A cardiac CT angiogram allows the health care provider to see how well blood is flowing to and from the heart. The provider will be able to see if there are any problems, such as: Blockage or narrowing of the arteries in the heart. Fluid around the heart. Signs of weakness or disease in the muscles, valves, and tissues of the heart. Tell a health care provider about: Any allergies you have. This is especially important if you have had a previous allergic reaction to medicines, contrast dye, or iodine. All medicines you are taking, including vitamins, herbs, eye drops, creams, and over-the-counter medicines. Any bleeding problems you have. Any surgeries you have had. Any medical conditions you have, including  kidney problems or kidney failure. Whether you are pregnant or may be pregnant. Any anxiety disorders, chronic pain, or other conditions you have. These may increase your stress or prevent you from lying still. Any history of abnormal heart rhythms or heart procedures. What are the risks? Your provider will talk with you about risks. These may include: Bleeding. Infection. Allergic reactions to medicines or dyes. Damage to other structures or organs. Kidney damage from the contrast dye. Increased risk of cancer from radiation exposure. This risk is low. Talk with your provider about: The risks and benefits of testing. How you can receive the lowest dose of radiation. What happens before the procedure? Wear comfortable clothing and remove any jewelry, glasses, dentures, and hearing aids. Follow instructions from your provider about eating and drinking. These may include: 12 hours before the procedure Avoid caffeine. This includes tea, coffee, soda, energy drinks, and diet pills. Drink plenty of water or other fluids that do not have caffeine in them. Being well hydrated can prevent complications. 4-6 hours before the procedure Stop eating and drinking. This will reduce the risk of nausea from the contrast dye. Ask your provider about changing or stopping your regular medicines. These include: Diabetes medicines. Medicines to treat problems with erections (erectile dysfunction). If you have kidney problems, you may need to receive IV hydration before and after the test. What happens during the procedure?  Hair on your chest may need to be removed so that small sticky patches called electrodes can be placed on your chest. These will transmit information that helps to monitor your heart during the procedure. An IV will be  inserted into one of your veins. You might be given a medicine to control your heart rate during the procedure. This will help to ensure that good images are  obtained. You will be asked to lie on an exam table. This table will slide in and out of the CT machine during the procedure. Contrast dye will be injected into the IV. You might feel warm, or you may get a metallic taste in your mouth. You may be given medicines to relax or dilate the arteries in your heart. If you are allergic to contrast dyes or iodine you may be given medicine before the test to reduce the risk of an allergic reaction. The table that you are lying on will move into the CT machine tunnel for the scan. The person running the machine will give you instructions while the scans are being done. You may be asked to: Keep your arms above your head. Hold your breath for short periods. Stay very still, even if the table is moving. The procedure may vary among providers and hospitals. What can I expect after the procedure? After your procedure, it is common to have: A metallic taste in your mouth from the contrast dye. A feeling of warmth. A headache from the heart medicine. Follow these instructions at home: Take over-the-counter and prescription medicines only as told by your provider. If you are told, drink enough fluid to keep your pee pale yellow. This will help to flush the contrast dye out of your body. Most people can return to their normal activities right after the procedure. Ask your provider what activities are safe for you. It is up to you to get the results of your procedure. Ask your provider, or the department that is doing the procedure, when your results will be ready. Contact a health care provider if: You have any symptoms of allergy to the contrast dye. These include: Shortness of breath. Rash or hives. A racing heartbeat. You notice a change in your peeing (urination). This information is not intended to replace advice given to you by your health care provider. Make sure you discuss any questions you have with your health care provider. Document Revised:  11/15/2021 Document Reviewed: 11/15/2021 Elsevier Patient Education  2024 Elsevier Inc.      Follow-Up: At [facility], you and your health needs are our priority.  As part of our continuing mission to provide you with exceptional heart care, we have created designated Provider Care Teams.  These Care Teams include your primary Cardiologist (physician) and Advanced Practice Providers (APPs -  Physician Assistants and Nurse Practitioners) who all work together to provide you with the care you need, when you need it.  We recommend signing up for the patient portal called "MyChart".  Sign up information is provided on this After Visit Summary.  MyChart is used to connect with patients for Virtual Visits (Telemedicine).  Patients are able to view lab/test results, encounter notes, upcoming appointments, etc.  Non-urgent messages can be sent to your provider as well.   To learn more about what you can do with MyChart, go to [email or web address].    Your next appointment:   1 year(s)  Provider:   Reatha Harps, MD     Other Instructions

## 2023-03-06 NOTE — Progress Notes (Signed)
Office Visit    Patient Name: Brad Massey Date of Encounter: 03/06/2023  Primary Care Provider:  Gaspar Garbe, MD Primary Cardiologist:  Reatha Harps, MD  Chief Complaint    67 year old male with a history of CAD, hypertension, hyperlipidemia, aortic root dilation, PE, COPD, and tobacco use who presents for follow-up related to CAD.  Past Medical History    Past Medical History:  Diagnosis Date   Cluster headache    COPD (chronic obstructive pulmonary disease) (HCC)    Coronary artery disease    DVT (deep venous thrombosis) (HCC)    Heart murmur    Hypertension    Past Surgical History:  Procedure Laterality Date   HEMORRHOID SURGERY     WISDOM TOOTH EXTRACTION      Allergies  Allergies  Allergen Reactions   Clindamycin/Lincomycin Rash     Labs/Other Studies Reviewed    The following studies were reviewed today:  Cardiac Studies & Procedures     STRESS TESTS  EXERCISE TOLERANCE TEST (ETT) 02/23/2020  Narrative  There was no ST segment deviation noted during stress.  No T wave inversion was noted during stress.  Blood pressure demonstrated a hypertensive response to exercise.  Patient exercised 6 minutes on a CVN-Bruce protocol achieving a work level of 7 METs. Max HR achieved was 137bpm which represents 86% of the maximal, age-predicted heart rate.  No ST-T wave changes noted during stress test. Isolated PVCs during rest and recovery.  No electrocardiographic evidence of ischemia.  Laurance Flatten, MD   ECHOCARDIOGRAM  ECHOCARDIOGRAM COMPLETE 05/01/2020  Narrative ECHOCARDIOGRAM REPORT    Patient Name:   Brad Massey    Date of Exam: 05/01/2020 Medical Rec #:  [id number]     Height:       72.0 in Accession #:    [id number]    Weight:       193.0 lb Date of Birth:  18-Apr-1956      BSA:          2.099 m Patient Age:    64 years      BP:           146/90 mmHg Patient Gender: M             HR:           74 bpm. Exam Location:  [address]  Procedure: 2D Echo, 3D Echo, Cardiac Doppler and Color Doppler  Indications:    I25.10 CAD  History:        Patient has no prior history of Echocardiogram examinations. CAD, Signs/Symptoms:Murmur and Shortness of Breath; Risk Factors:Family History of Coronary Artery Disease, Hypertension, Dyslipidemia and Current Smoker.  Sonographer:    Farrel Conners RDCS Referring Phys: [id number] Ronnald Ramp O'NEAL  IMPRESSIONS   1. Left ventricular ejection fraction, by estimation, is 55 to 60%. The left ventricle has normal function. The left ventricle has no regional wall motion abnormalities. Left ventricular diastolic parameters were normal. 2. Right ventricular systolic function is normal. The right ventricular size is normal. 3. The mitral valve is normal in structure. Trivial mitral valve regurgitation. No evidence of mitral stenosis. 4. The aortic valve is tricuspid. Aortic valve regurgitation is not visualized. No aortic stenosis is present. 5. Aortic dilatation noted. There is mild dilatation of the aortic root, measuring 42 mm. 6. The inferior vena cava is normal in size with greater than 50% respiratory variability, suggesting right atrial pressure of 3 mmHg.  FINDINGS Left Ventricle: Left ventricular  ejection fraction, by estimation, is 55 to 60%. The left ventricle has normal function. The left ventricle has no regional wall motion abnormalities. The left ventricular internal cavity size was normal in size. There is no left ventricular hypertrophy. Left ventricular diastolic parameters were normal.  Right Ventricle: The right ventricular size is normal. Right ventricular systolic function is normal.  Left Atrium: Left atrial size was normal in size.  Right Atrium: Right atrial size was normal in size.  Pericardium: There is no evidence of pericardial effusion.  Mitral Valve: The mitral valve is normal in structure. Trivial mitral valve regurgitation. No evidence of  mitral valve stenosis.  Tricuspid Valve: The tricuspid valve is normal in structure. Tricuspid valve regurgitation is not demonstrated. No evidence of tricuspid stenosis.  Aortic Valve: The aortic valve is tricuspid. Aortic valve regurgitation is not visualized. No aortic stenosis is present.  Pulmonic Valve: The pulmonic valve was normal in structure. Pulmonic valve regurgitation is trivial. No evidence of pulmonic stenosis.  Aorta: Aortic dilatation noted. There is mild dilatation of the aortic root, measuring 42 mm.  Venous: The inferior vena cava is normal in size with greater than 50% respiratory variability, suggesting right atrial pressure of 3 mmHg.  IAS/Shunts: No atrial level shunt detected by color flow Doppler.   LEFT VENTRICLE PLAX 2D LVIDd:         4.37 cm  Diastology LVIDs:         3.00 cm  LV e' medial:    11.10 cm/s LV PW:         0.97 cm  LV E/e' medial:  7.6 LV IVS:        0.80 cm  LV e' lateral:   13.40 cm/s LVOT diam:     2.50 cm  LV E/e' lateral: 6.3 LV SV:         103 LV SV Index:   49 LVOT Area:     4.91 cm  3D Volume EF: 3D EF:        71 % LV EDV:       146 ml LV ESV:       42 ml LV SV:        105 ml  RIGHT VENTRICLE RV S prime:     15.20 cm/s TAPSE (M-mode): 2.1 cm  LEFT ATRIUM             Index       RIGHT ATRIUM           Index LA diam:        3.05 cm 1.45 cm/m  RA Area:     14.50 cm LA Vol (A2C):   36.7 ml 17.49 ml/m RA Volume:   37.60 ml  17.92 ml/m LA Vol (A4C):   41.4 ml 19.73 ml/m LA Biplane Vol: 39.7 ml 18.92 ml/m AORTIC VALVE LVOT Vmax:   115.00 cm/s LVOT Vmean:  77.900 cm/s LVOT VTI:    0.211 m  AORTA Ao Root diam: 4.20 cm Ao Asc diam:  3.70 cm  MITRAL VALVE MV Area (PHT): cm         SHUNTS MV Decel Time: 189 msec    Systemic VTI:  0.21 m MV E velocity: 84.75 cm/s  Systemic Diam: 2.50 cm MV A velocity: 93.95 cm/s MV E/A ratio:  0.90  Olga Millers MD Electronically signed by Olga Millers MD Signature Date/Time:  05/01/2020/11:50:09 AM    Final     CT SCANS  CT CARDIAC SCORING (SELF PAY  ONLY) 01/31/2020  Addendum 01/31/2020 11:29 AM ADDENDUM REPORT: 01/31/2020 11:27  CLINICAL DATA:  Risk stratification  EXAM: Coronary Calcium Score  TECHNIQUE: The patient was scanned on a [employer] scanner. Axial non-contrast 3 mm slices were carried out through the heart. The data set was analyzed on a dedicated work station and scored using the Agatson method.  FINDINGS: Non-cardiac: See separate report from [facility].  Ascending Aorta: Upper normal size of the ascending aorta (39 mm), no calcifications.  Pericardium: Normal.  Coronary arteries: Normal origin.  IMPRESSION: Coronary calcium score of 1082. This was 95 percentile for age and sex matched control.   Electronically Signed By: Tobias Alexander On: 01/31/2020 11:27  Narrative EXAM: OVER-READ INTERPRETATION  CT CHEST  The following report is an over-read performed by radiologist Dr. Trudie Reed of [facility] on 01/31/2020. This over-read does not include interpretation of cardiac or coronary anatomy or pathology. The coronary calcium score/coronary CTA interpretation by the cardiologist is attached.  COMPARISON:  No priors.  FINDINGS: Mild scarring in the inferior segment of the lingula and right middle lobe. Within the visualized portions of the thorax there are no suspicious appearing pulmonary nodules or masses, there is no acute consolidative airspace disease, no pleural effusions, no pneumothorax and no lymphadenopathy. Visualized portions of the upper abdomen are unremarkable. There are no aggressive appearing lytic or blastic lesions noted in the visualized portions of the skeleton.  IMPRESSION: No significant incidental noncardiac findings are noted.  Electronically Signed: By: Trudie Reed M.D. On: 01/31/2020 10:58         Recent Labs: No results found for requested  labs within last 365 days.  Recent Lipid Panel    Component Value Date/Time   CHOL 117 04/12/2020 0953   TRIG 66 04/12/2020 0953   HDL 46 04/12/2020 0953   CHOLHDL 2.5 04/12/2020 0953   LDLCALC 57 04/12/2020 0953    History of Present Illness    67 year old male with a history of CAD, hypertension, hyperlipidemia, aortic root dilation, PE, COPD, and tobacco use.   Coronary calcium score in 2021 was 1082 (95th percentile). ETT in 01/2020 was negative.  Echocardiogram in 04/2020 showed EF 55 to 60%, normal LV function, no RWMA, normal RV, no significant valvular abnormalities mild dilation of the aortic root measuring 42 mm.  He was last seen in the office on 02/19/2022 and was stable from a cardiac standpoint.  He denied symptoms concerning for angina.  He presents today for follow-up. Since his last visit he has been stable overall from a cardiac standpoint.  He has stable chronic dyspnea in the setting of COPD, he notes that he had to use extra nebulizer treatments last night as he felt he was having a COPD exacerbation.  He denies any symptoms concerning for angina.  He is participating in pulmonary rehab, overall this has been going well. He continues to smoke. He notes that he is no longer taking verapamil and has switched to metoprolol for management of his cluster headaches per PCP. Other than his recent COPD exacerbation, he reports feeling well.  Home Medications    Current Outpatient Medications  Medication Sig Dispense Refill   albuterol (VENTOLIN HFA) 108 (90 Base) MCG/ACT inhaler Inhale into the lungs every 6 (six) hours as needed for wheezing or shortness of breath.     aspirin EC 81 MG tablet Take 1 tablet (81 mg total) by mouth daily. Swallow whole. 90 tablet 3   Budeson-Glycopyrrol-Formoterol (BREZTRI AEROSPHERE) 160-9-4.8  MCG/ACT AERO Inhale 2 puffs into the lungs in the morning and at bedtime. 10.7 g 5   metoprolol succinate (TOPROL-XL) 100 MG 24 hr tablet Take 100 mg by  mouth daily.     rosuvastatin (CRESTOR) 40 MG tablet Take 1 tablet (40 mg total) by mouth daily. 30 tablet 0   nitroGLYCERIN (NITROSTAT) 0.4 MG SL tablet Place 1 tablet (0.4 mg total) under the tongue every 5 (five) minutes as needed for chest pain. 25 tablet 3   verapamil (CALAN) 120 MG tablet Take 120 mg by mouth 3 (three) times daily. (Patient not taking: Reported on 03/06/2023)     Current Facility-Administered Medications  Medication Dose Route Frequency Provider Last Rate Last Admin   0.9 %  sodium chloride infusion  500 mL Intravenous Continuous Armbruster, Willaim Rayas, MD         Review of Systems    He denies chest pain, palpitations, pnd, orthopnea, n, v, dizziness, syncope, edema, weight gain, or early satiety. All other systems reviewed and are otherwise negative except as noted above.   Physical Exam    VS:  BP 132/88   Pulse 71   Ht 5\' 11"  (1.803 m)   Wt 189 lb 3.2 oz (85.8 kg)   SpO2 92%   BMI 26.39 kg/m  GEN: Well nourished, well developed, in no acute distress. HEENT: normal. Neck: Supple, no JVD, carotid bruits, or masses. Cardiac: RRR, no murmurs, rubs, or gallops. No clubbing, cyanosis, edema.  Radials/DP/PT 2+ and equal bilaterally.  Respiratory:  Respirations regular and unlabored, clear to auscultation bilaterally. GI: Soft, nontender, nondistended, BS + x 4. MS: no deformity or atrophy. Skin: warm and dry, no rash. Neuro:  Strength and sensation are intact. Psych: Normal affect.  Accessory Clinical Findings    ECG personally reviewed by me today - EKG Interpretation Date/Time:  Friday March 06 2023 10:05:49 EST Ventricular Rate:  71 PR Interval:  168 QRS Duration:  94 QT Interval:  414 QTC Calculation: 449 R Axis:   5  Text Interpretation: Normal sinus rhythm No significant change since last tracing Confirmed by Bernadene Person ([id number]) on 03/06/2023 10:16:20 AM  - no acute changes.   No results found for: "WBC", "HGB", "HCT", "MCV", "PLT" No results  found for: "CREATININE", "BUN", "NA", "K", "CL", "CO2" No results found for: "ALT", "AST", "GGT", "ALKPHOS", "BILITOT" Lab Results  Component Value Date   CHOL 117 04/12/2020   HDL 46 04/12/2020   LDLCALC 57 04/12/2020   TRIG 66 04/12/2020   CHOLHDL 2.5 04/12/2020    No results found for: "HGBA1C"  Assessment & Plan    1. CAD: Coronary calcium score in 2021 was 1082 (95th percentile). ETT in 01/2020 was negative. He has stable chronic dyspnea in the setting of COPD, unchanged from prior visits.  He denies any other symptoms concerning for angina. Continue aspirin, Crestor.  2. Hypertension: BP well controlled. Continue current antihypertensive regimen.   3. Hyperlipidemia: LDL was 59 in 11/2021.  He has since had labs per his PCP.  Will request copy.  Continue Crestor.  4. Aortic root dilation: Echo in 2022 showed mild dilation of the aortic root measuring 42 mm.  Will check CT chest/aorta for routine monitoring.    5. COPD/tobacco use: Recent exacerbation, follows with pulmonology.  He is participating in pulmonary rehab.  He continues to smoke.  Full cessation advised.  6. Disposition: Follow-up in 1 year, sooner if needed.       Joylene Grapes,  NP 03/06/2023, 10:39 AM

## 2023-03-10 ENCOUNTER — Encounter (HOSPITAL_COMMUNITY)
Admission: RE | Admit: 2023-03-10 | Discharge: 2023-03-10 | Disposition: A | Discharge status: Home / Self Care | Payer: Medicare Other | Source: Ambulatory Visit | Attending: Pulmonary Disease | Admitting: Pulmonary Disease

## 2023-03-10 VITALS — Wt 188.5 lb

## 2023-03-10 DIAGNOSIS — J449 Chronic obstructive pulmonary disease, unspecified: Secondary | ICD-10-CM

## 2023-03-10 DIAGNOSIS — F17210 Nicotine dependence, cigarettes, uncomplicated: Secondary | ICD-10-CM | POA: Diagnosis not present

## 2023-03-10 NOTE — Progress Notes (Signed)
Daily Session Note  Patient Details  Name: Brad Massey MRN: [id number] Date of Birth: [birth year] Referring Provider:   Doristine Devoid Pulmonary Rehab Walk Test from [facility]/05/2023 in [facility]  Referring Provider [name]       Encounter Date: 03/10/2023  Check In:  Session Check In - 03/10/23 1330       Check-In   Supervising physician immediately available to respond to emergencies [facility] MD immediately available    Physician(s) Lorin Picket, NP    Location [facility]    Staff Present Essie Hart, RN, BSN;Elvin Banker Katrinka Blazing, Zella Richer, MS, ACSM-CEP, Exercise Physiologist;Randi Dionisio Paschal, ACSM-CEP, Exercise Physiologist    Virtual Visit No    Medication changes reported     No    Fall or balance concerns reported    No    Tobacco Cessation No Change    Warm-up and Cool-down Performed as group-led instruction    Resistance Training Performed Yes    VAD Patient? No    PAD/SET Patient? No      Pain Assessment   Currently in Pain? No/denies    Pain Score 0-No pain    Multiple Pain Sites No             Capillary Blood Glucose: No results found for this or any previous visit (from the past 24 hour(s)).   Exercise Prescription Changes - 03/10/23 1500       Response to Exercise   Blood Pressure (Admit) 140/82    Blood Pressure (Exercise) 136/84    Blood Pressure (Exit) 118/70    Heart Rate (Admit) 70 bpm    Heart Rate (Exercise) 86 bpm    Heart Rate (Exit) 63 bpm    Oxygen Saturation (Admit) 93 %    Oxygen Saturation (Exercise) 91 %    Oxygen Saturation (Exit) 94 %    Rating of Perceived Exertion (Exercise) 12    Perceived Dyspnea (Exercise) 1    Duration Continue with 30 min of aerobic exercise without signs/symptoms of physical distress.    Intensity THRR unchanged      Progression   Progression Continue to progress workloads to maintain intensity without signs/symptoms of physical  distress.      Resistance Training   Training Prescription Yes    Weight black bands    Reps 10-15    Time 10 Minutes      Treadmill   MPH 2.8    Grade 2    Minutes 15    METs 3.8      Elliptical   Level 3    Speed 3    Minutes 15    METs 4.5             Social History   Tobacco Use  Smoking Status Every Day   Current packs/day: 1.00   Average packs/day: 1 pack/day for 46.7 years (46.7 ttl pk-yrs)   Types: Cigarettes   Start date: 06/25/1976  Smokeless Tobacco Never  Tobacco Comments   smokes 1 ppd    Goals Met:  Proper associated with RPD/PD & O2 Sat Independence with exercise equipment Exercise tolerated well No report of concerns or symptoms today Strength training completed today  Goals Unmet:  Not Applicable  Comments: Service time is from 1313 to 1430.    Dr. Mechele Collin is Medical Director for Pulmonary Rehab at [facility].

## 2023-03-12 ENCOUNTER — Encounter (HOSPITAL_COMMUNITY)
Admission: RE | Admit: 2023-03-12 | Discharge: 2023-03-12 | Disposition: A | Discharge status: Home / Self Care | Payer: Medicare Other | Source: Ambulatory Visit | Attending: Pulmonary Disease | Admitting: Pulmonary Disease

## 2023-03-12 DIAGNOSIS — F17210 Nicotine dependence, cigarettes, uncomplicated: Secondary | ICD-10-CM | POA: Diagnosis not present

## 2023-03-12 DIAGNOSIS — J449 Chronic obstructive pulmonary disease, unspecified: Secondary | ICD-10-CM | POA: Diagnosis not present

## 2023-03-12 NOTE — Progress Notes (Signed)
Daily Session Note  Patient Details  Name: Brad Massey MRN: [id number] Date of Birth: 10-Sep-1955 Referring Provider:   Doristine Devoid Pulmonary Rehab Walk Test from 01/05/2023 in [facility], [facility], [facility]  Referring Provider [name]       Encounter Date: 03/12/2023  Check In:  Session Check In - 03/12/23 1348       Check-In   Supervising physician immediately available to respond to emergencies [facility] MD immediately available    Physician(s) Bernadene Person, NP    Location [facility]    Staff Present Durel Salts, Zella Richer, MS, ACSM-CEP, Exercise Physiologist;Randi Dionisio Paschal, ACSM-CEP, Exercise Physiologist;Samantha Belarus, RD, LDN    Virtual Visit No    Medication changes reported     No    Fall or balance concerns reported    No    Tobacco Cessation No Change    Warm-up and Cool-down Performed as group-led instruction    Resistance Training Performed Yes    VAD Patient? No    PAD/SET Patient? No      Pain Assessment   Currently in Pain? No/denies    Multiple Pain Sites No             Capillary Blood Glucose: No results found for this or any previous visit (from the past 24 hour(s)).    Social History   Tobacco Use  Smoking Status Every Day   Current packs/day: 1.00   Average packs/day: 1 pack/day for 46.7 years (46.7 ttl pk-yrs)   Types: Cigarettes   Start date: 06/25/1976  Smokeless Tobacco Never  Tobacco Comments   smokes 1 ppd    Goals Met:  Independence with exercise equipment Exercise tolerated well No report of concerns or symptoms today Strength training completed today  Goals Unmet:  Not Applicable  Comments: Service time is from 1318 to 1439    Dr. Mechele Collin is Medical Director for Pulmonary Rehab at [facility].

## 2023-03-16 DIAGNOSIS — K080 Exfoliation of teeth due to systemic causes: Secondary | ICD-10-CM | POA: Diagnosis not present

## 2023-03-17 ENCOUNTER — Encounter (HOSPITAL_COMMUNITY): Payer: Medicare Other

## 2023-03-17 ENCOUNTER — Encounter (HOSPITAL_COMMUNITY)
Admission: RE | Admit: 2023-03-17 | Discharge: 2023-03-17 | Disposition: A | Discharge status: Home / Self Care | Payer: Medicare Other | Source: Ambulatory Visit | Attending: Pulmonary Disease | Admitting: Pulmonary Disease

## 2023-03-17 DIAGNOSIS — F17210 Nicotine dependence, cigarettes, uncomplicated: Secondary | ICD-10-CM | POA: Diagnosis not present

## 2023-03-17 DIAGNOSIS — J449 Chronic obstructive pulmonary disease, unspecified: Secondary | ICD-10-CM | POA: Diagnosis not present

## 2023-03-17 NOTE — Progress Notes (Signed)
Daily Session Note  Patient Details  Name: Brad Massey MRN: [id number] Date of Birth: 03/10/1956 Referring Provider:   Doristine Devoid Pulmonary Rehab Walk Test from 01/05/2023 in [facility], [facility], & [facility]  Referring Provider [name]       Encounter Date: 03/17/2023  Check In:  Session Check In - 03/17/23 1421       Check-In   Supervising physician immediately available to respond to emergencies [facility] MD immediately available    Physician(s) Bernadene Person, NP    [facility]    Staff Present Durel Salts, Zella Richer, MS, ACSM-CEP, Exercise Physiologist;Eoghan Belcher Dionisio Paschal, ACSM-CEP, Exercise Physiologist;Samantha Belarus, RD, Dutch Gray, RN, BSN    Virtual Visit No    Medication changes reported     No    Fall or balance concerns reported    No    Tobacco Cessation No Change    Warm-up and Cool-down Performed as group-led Writer Performed Yes    VAD Patient? No    PAD/SET Patient? No      Pain Assessment   Currently in Pain? No/denies    Multiple Pain Sites No             Capillary Blood Glucose: No results found for this or any previous visit (from the past 24 hour(s)).    Social History   Tobacco Use  Smoking Status Every Day   Current packs/day: 1.00   Average packs/day: 1 pack/day for 46.7 years (46.7 ttl pk-yrs)   Types: Cigarettes   Start date: 06/25/1976  Smokeless Tobacco Never  Tobacco Comments   smokes 1 ppd    Goals Met:  Independence with exercise equipment Exercise tolerated well No report of concerns or symptoms today Strength training completed today  Goals Unmet:  Not Applicable  Comments: Service time is from 1322 to 1444.    Dr. Mechele Collin is Medical Director for Pulmonary Rehab at [facility].

## 2023-03-18 ENCOUNTER — Ambulatory Visit (HOSPITAL_COMMUNITY)
Admission: RE | Admit: 2023-03-18 | Discharge: 2023-03-18 | Disposition: A | Discharge status: Home / Self Care | Payer: Medicare Other | Source: Ambulatory Visit | Attending: Nurse Practitioner | Admitting: Nurse Practitioner

## 2023-03-18 DIAGNOSIS — I719 Aortic aneurysm of unspecified site, without rupture: Secondary | ICD-10-CM | POA: Diagnosis not present

## 2023-03-18 DIAGNOSIS — J432 Centrilobular emphysema: Secondary | ICD-10-CM | POA: Diagnosis not present

## 2023-03-18 DIAGNOSIS — I700 Atherosclerosis of aorta: Secondary | ICD-10-CM | POA: Diagnosis not present

## 2023-03-18 DIAGNOSIS — I77810 Thoracic aortic ectasia: Secondary | ICD-10-CM | POA: Insufficient documentation

## 2023-03-18 DIAGNOSIS — I2510 Atherosclerotic heart disease of native coronary artery without angina pectoris: Secondary | ICD-10-CM | POA: Diagnosis not present

## 2023-03-18 MED ORDER — IOHEXOL 350 MG/ML SOLN
75.0000 mL | Freq: Once | INTRAVENOUS | Status: AC | PRN
Start: 2023-03-18 — End: 2023-03-18
  Administered 2023-03-18: 75 mL via INTRAVENOUS

## 2023-03-19 ENCOUNTER — Encounter (HOSPITAL_COMMUNITY)
Admission: RE | Admit: 2023-03-19 | Discharge: 2023-03-19 | Disposition: A | Discharge status: Home / Self Care | Payer: Medicare Other | Source: Ambulatory Visit | Attending: Pulmonary Disease | Admitting: Pulmonary Disease

## 2023-03-19 DIAGNOSIS — J449 Chronic obstructive pulmonary disease, unspecified: Secondary | ICD-10-CM

## 2023-03-19 DIAGNOSIS — F17210 Nicotine dependence, cigarettes, uncomplicated: Secondary | ICD-10-CM | POA: Diagnosis not present

## 2023-03-19 NOTE — Progress Notes (Signed)
Daily Session Note  Patient Details  Name: Brad Massey MRN: [id number] Date of Birth: 02-Aug-1955 Referring Provider:   Doristine Devoid Pulmonary Rehab Walk Test from 01/05/2023 in [facility] for [facility], Vascular, & Lung [facility]  Referring Provider [name]       Encounter Date: 03/19/2023  Check In:  Session Check In - 03/19/23 1330       Check-In   Supervising physician immediately available to respond to emergencies [facility] MD immediately available    Physician(s) Reather Littler, NP    [facility]    Staff Present Durel Salts, Zella Richer, MS, ACSM-CEP, Exercise Physiologist;Randi Idelle Crouch BS, ACSM-CEP, Exercise Physiologist;Samantha Belarus, RD, Dutch Gray, RN, BSN    Virtual Visit No    Medication changes reported     No    Fall or balance concerns reported    No    Tobacco Cessation No Change    Warm-up and Cool-down Performed as group-led Writer Performed Yes    VAD Patient? No    PAD/SET Patient? No      Pain Assessment   Currently in Pain? No/denies    Multiple Pain Sites No             Capillary Blood Glucose: No results found for this or any previous visit (from the past 24 hour(s)).    Social History   Tobacco Use  Smoking Status Every Day   Current packs/day: 1.00   Average packs/day: 1 pack/day for 46.7 years (46.7 ttl pk-yrs)   Types: Cigarettes   Start date: 06/25/1976  Smokeless Tobacco Never  Tobacco Comments   smokes 1 ppd    Goals Met:  Proper associated with RPD/PD & O2 Sat Independence with exercise equipment Exercise tolerated well No report of concerns or symptoms today Strength training completed today  Goals Unmet:  Not Applicable  Comments: Service time is from 1312 to 1437.    Dr. Mechele Collin is Medical Director for Pulmonary Rehab at [facility].

## 2023-03-24 ENCOUNTER — Ambulatory Visit (HOSPITAL_BASED_OUTPATIENT_CLINIC_OR_DEPARTMENT_OTHER): Payer: Medicare Other | Admitting: Pulmonary Disease

## 2023-03-24 ENCOUNTER — Encounter (HOSPITAL_COMMUNITY)
Admission: RE | Admit: 2023-03-24 | Discharge: 2023-03-24 | Disposition: A | Discharge status: Home / Self Care | Payer: Medicare Other | Source: Ambulatory Visit | Attending: Pulmonary Disease | Admitting: Pulmonary Disease

## 2023-03-24 ENCOUNTER — Encounter (HOSPITAL_COMMUNITY): Payer: Self-pay

## 2023-03-24 ENCOUNTER — Encounter (HOSPITAL_BASED_OUTPATIENT_CLINIC_OR_DEPARTMENT_OTHER): Payer: Self-pay | Admitting: Pulmonary Disease

## 2023-03-24 VITALS — Wt 188.3 lb

## 2023-03-24 VITALS — BP 138/86 | HR 80 | Resp 16 | Ht 71.0 in | Wt 190.8 lb

## 2023-03-24 DIAGNOSIS — J449 Chronic obstructive pulmonary disease, unspecified: Secondary | ICD-10-CM | POA: Diagnosis not present

## 2023-03-24 DIAGNOSIS — F17210 Nicotine dependence, cigarettes, uncomplicated: Secondary | ICD-10-CM | POA: Diagnosis not present

## 2023-03-24 DIAGNOSIS — Z23 Encounter for immunization: Secondary | ICD-10-CM | POA: Diagnosis not present

## 2023-03-24 NOTE — Progress Notes (Signed)
Subjective:   PATIENT ID: Brad Massey GENDER: male DOB: 06/16/1955, MRN: [id number]   HPI  Chief Complaint  Patient presents with   Follow-up    Doing better since August. Has been doing pulm rehab and its going well. H/A have cleared up, rehab says he is doing well. Asking about the Aorta CT he just had.     Reason for Visit: Follow-up COPD  [name] is a 67 year old active smoker with CAD, HLD and HTN who presents for follow-up.  Synopsis: He was recently see on 04/06/20 by Cardiology with Dr. Flora Lipps for shortness of breath. He had PFTs ordered which demonstrated severe COPD. From a cardiac standpoint he has completed an exercise stress test that was negative. Remains an active smoker. He reports history of gradually worsening shortness of breath in the last year. He notices it with walking uphill and heavy exertion. He has productive cough daily that is new and usually after meals. Sputum is thick and clear. Rarely wheezing. Last week he was able to walk several miles on the beach. Shortness of breath worsens with the cold due to inactivity. He is planning on joining a gym today. 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  Since our last visit, he reports intermittent use of Anoro 2-3 days a week but working on more daily use. He has occasional chronic cough with sputum production. He continues to smoke. He is planning on quitting and has patches and gum at the house. His wife is a non smoker and is supportive. Has shortness of breath with moderate and heavy exertion including when walking uphill. He remains active and recently went on a 6 mile walking trail in [place] where he only required a few stops but able to complete the trail in a few hours. He is also planning for dental work this week and questions risk of thrush with inhalers.  12/24/22 Our last visit on was 09/18/20 and was on Anoro for his COPD. Not on maintenance therapy with use of  albuterol intermittently. He had rectal surgery in May 2024 for internal and external hemorrhoids. Has had difficulty with pain post-op. Also developed cluster headaches and treated with meds, steroids shots. During this time he has had gradually worsening shortness of breath and swelling of legs. Seen by PCP two weeks ago and treated with IM steroid and Augmentin. Never got diuretics. Lower extremity swelling self resolved. He reports he is still smoking 1 ppd. Previously walking 5 miles ever other daily. Has not exercised recently. At baseline now he has occasional cough that may be sinus drainage related.   03/24/23 Has been participating in pulmonary rehab and working well. Near graduation. Will likely plan to join [employer] with his wife afterwards. He does have have some mucous production that is minimal at night. Did have a mild respiratory illness last month requiring to sleep sitting up but doing well now. Pulmonary rehab has increased his stamina. He still has shortness of breath with carrying heavy loads. Denies wheezing. He is smoking 3/4 pack a day and working to quitting. Has not been taking the Breztri consistently  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   Past Medical History:  Diagnosis Date   Cluster headache    COPD (chronic obstructive pulmonary disease) (HCC)    Coronary artery disease    DVT (deep venous thrombosis) (HCC)    Heart murmur    Hypertension  Family History  Problem Relation Age of Onset   Heart disease Mother    Arrhythmia Mother    Heart disease Father    Colon cancer Neg Hx    Colitis Neg Hx    Esophageal cancer Neg Hx    Rectal cancer Neg Hx    Stomach cancer Neg Hx      Social History   Occupational History   Not on file  Tobacco Use   Smoking status: Every Day    Current packs/day: 1.00    Average packs/day: 1 pack/day for 46.7 years (46.7 ttl pk-yrs)    Types: Cigarettes    Start date: 06/25/1976   Smokeless tobacco: Never    Tobacco comments:    smokes 1 ppd  Substance and Sexual Activity   Alcohol use: Not Currently   Drug use: Never   Sexual activity: Yes    Partners: Female    Allergies  Allergen Reactions   Clindamycin/Lincomycin Rash     Outpatient Medications Prior to Visit  Medication Sig Dispense Refill   albuterol (VENTOLIN HFA) 108 (90 Base) MCG/ACT inhaler Inhale into the lungs every 6 (six) hours as needed for wheezing or shortness of breath.     aspirin EC 81 MG tablet Take 1 tablet (81 mg total) by mouth daily. Swallow whole. 90 tablet 3   Budeson-Glycopyrrol-Formoterol (BREZTRI AEROSPHERE) 160-9-4.8 MCG/ACT AERO Inhale 2 puffs into the lungs in the morning and at bedtime. 10.7 g 5   metoprolol succinate (TOPROL-XL) 100 MG 24 hr tablet Take 100 mg by mouth daily.     rosuvastatin (CRESTOR) 40 MG tablet Take 1 tablet (40 mg total) by mouth daily. 30 tablet 0   nitroGLYCERIN (NITROSTAT) 0.4 MG SL tablet Place 1 tablet (0.4 mg total) under the tongue every 5 (five) minutes as needed for chest pain. 25 tablet 3   verapamil (CALAN) 120 MG tablet Take 120 mg by mouth 3 (three) times daily. (Patient not taking: Reported on 03/06/2023)     Facility-Administered Medications Prior to Visit  Medication Dose Route Frequency Provider Last Rate Last Admin   0.9 %  sodium chloride infusion  500 mL Intravenous Continuous Armbruster, Willaim Rayas, MD        Review of Systems  Constitutional:  Negative for chills, diaphoresis, fever, malaise/fatigue and weight loss.  HENT:  Negative for congestion.   Respiratory:  Positive for shortness of breath. Negative for cough, hemoptysis, sputum production and wheezing.   Cardiovascular:  Negative for chest pain, palpitations and leg swelling.     Objective:   Vitals:   03/24/23 1328  BP: 138/86  Pulse: 80  Resp: 16  SpO2: 94%  Weight: 190 lb 12.8 oz (86.5 kg)  Height: 5\' 11"  (1.803 m)     SpO2: 94 % Physical Exam: General: Well-appearing, no acute  distress HENT: [place], AT Eyes: EOMI, no scleral icterus Respiratory: Clear to auscultation bilaterally.  No crackles, wheezing or rales Cardiovascular: RRR, -M/R/G, no JVD Extremities:-Edema,-tenderness Neuro: AAO x4, CNII-XII grossly intact Psych: Normal mood, normal affect   Data Reviewed:  Imaging: CT Cardiac scoring 01/31/20 - Lung fields with no infiltrate, effusion or edema. Lungs with no pulmonary nodules or masses CTA Chest Aorta 03/18/23 - Scarring with mild volume loss in lingula with nodular configuration in 9mm, centrilobular emphysema  PFT: 05/24/20 FVC 3.65 (72%) FEV1 1.86 (49%) Ratio 45  TLC 106% RV 142% RV/TLC 130% DLCO 69% Interpretation: Severe obstructive defect with air trapping and reduced DLCO consistent with emphysema.  Significant bronchodilator response present.  Labs: CBC No results found for: "WBC", "RBC", "HGB", "HCT", "PLT", "MCV", "MCH", "MCHC", "RDW", "LYMPHSABS", "MONOABS", "EOSABS", "BASOSABS"  Assessment & Plan:   Discussion: 67 year old male active smoker who presents for COPD follow-up. Mild symptoms but not consistently using maintenance inhalers. Counseled on inhaler compliance. Discussed clinical course and management of COPD including bronchodilator regimen, preventive care and action plan for exacerbation.  Severe COPD --START Breztri TWO puffs in the morning and evening. Rinse mouth out after use --CONTINUE Albuterol AS NEEDED for shortness of breath or wheezing. OK to use prior to exercise --CONTINUE pulmonary rehab and start [employer] exercise once you graduate --Encouraged regular aerobic exercise five days a week for 30 min  Tobacco abuse Patient is an active smoker. We discussed smoking cessation for <3 minutes. We discussed triggers and stressors and ways to deal with them. We discussed barriers to continued smoking and benefits of smoking cessation. Provided patient with information cessation techniques and interventions including [place]  quitline.   Health Maintenance Immunization History  Administered Date(s) Administered   Influenza,inj,Quad PF,6+ Mos 12/28/2019   PFIZER(Purple Top)SARS-COV-2 Vaccination 07/15/2019, 08/04/2019, 04/23/2020   Pfizer Covid-19 Vaccine Bivalent Booster 31yrs & up 02/12/2021   Pfizer(Comirnaty)Fall Seasonal Vaccine 12 years and older 04/17/2022   CT Lung Screen - We discussed risks and benefits of screen. Patient declined for now and will like to revisit at next   No orders of the defined types were placed in this encounter.  No orders of the defined types were placed in this encounter.  Return in about 5 months (around 08/22/2023).  I have spent a total time of 35-minutes on the day of the appointment including chart review, data review, collecting history, coordinating care and discussing medical diagnosis and plan with the patient/family. Past medical history, allergies, medications were reviewed. Pertinent imaging, labs and tests included in this note have been reviewed and interpreted independently by me.  Simrin Vegh Mechele Collin, MD [facility] 03/24/2023 1:37 PM  Office Number [phone]

## 2023-03-24 NOTE — Patient Instructions (Signed)
Severe COPD --START Breztri TWO puffs in the morning and evening. Rinse mouth out after use --CONTINUE Albuterol AS NEEDED for shortness of breath or wheezing. OK to use prior to exercise --CONTINUE pulmonary rehab and start [employer] exercise once you graduate --Encouraged regular aerobic exercise five days a week for 30 min  Tobacco abuse Patient is an active smoker. We discussed smoking cessation for <3 minutes. We discussed triggers and stressors and ways to deal with them. We discussed barriers to continued smoking and benefits of smoking cessation. Provided patient with information cessation techniques and interventions including [place] quitline.

## 2023-03-24 NOTE — Progress Notes (Signed)
Daily Session Note  Patient Details  Name: Brad Massey MRN: [id number] Date of Birth: [birth year] Referring Provider:   Doristine Devoid Pulmonary Rehab Walk Test from 01/05/2023 in [facility] for Heart, Vascular, & Lung Health  Referring Provider [name]       Encounter Date: 03/24/2023  Check In:  Session Check In - 03/24/23 1127       Check-In   Supervising physician immediately available to respond to emergencies [facility] MD immediately available    Physician(s) Jari Favre, PA    Location [facility]    Staff Present Durel Salts, Zella Richer, MS, ACSM-CEP, Exercise Physiologist;Jarae Panas Gerre Scull, RN, BSN;Randi Reeve BS, ACSM-CEP, Exercise Physiologist    Virtual Visit No    Medication changes reported     No    Fall or balance concerns reported    No    Tobacco Cessation No Change    Warm-up and Cool-down Performed as group-led instruction    Resistance Training Performed Yes    VAD Patient? No    PAD/SET Patient? No      Pain Assessment   Currently in Pain? No/denies    Pain Score 0-No pain    Multiple Pain Sites No             Capillary Blood Glucose: No results found for this or any previous visit (from the past 24 hour(s)).   Exercise Prescription Changes - 03/24/23 1100       Response to Exercise   Blood Pressure (Admit) 136/76    Blood Pressure (Exercise) 172/80    Blood Pressure (Exit) 114/76    Heart Rate (Admit) 90 bpm    Heart Rate (Exercise) 104 bpm    Heart Rate (Exit) 88 bpm    Oxygen Saturation (Admit) 92 %    Oxygen Saturation (Exercise) 92 %    Oxygen Saturation (Exit) 94 %    Rating of Perceived Exertion (Exercise) 13    Perceived Dyspnea (Exercise) 2.5    Duration Continue with 30 min of aerobic exercise without signs/symptoms of physical distress.    Intensity THRR unchanged      Progression   Progression Continue to progress workloads to maintain intensity without signs/symptoms of physical distress.       Resistance Training   Training Prescription Yes    Weight black bands    Reps 10-15    Time 10 Minutes      Treadmill   MPH 2.6    Grade 2    Minutes 15    METs 3.6      Elliptical   Level 4    Speed 3    Minutes 15    METs 4.3             Social History   Tobacco Use  Smoking Status Every Day   Current packs/day: 1.00   Average packs/day: 1 pack/day for 46.7 years (46.7 ttl pk-yrs)   Types: Cigarettes   Start date: 06/25/1976  Smokeless Tobacco Never  Tobacco Comments   smokes 1 ppd    Goals Met:  Independence with exercise equipment Exercise tolerated well No report of concerns or symptoms today Strength training completed today  Goals Unmet:  Not Applicable  Comments: Service time is from 1007 to 1128    Dr. Mechele Collin is Medical Director for Pulmonary Rehab at [facility].

## 2023-03-25 NOTE — Progress Notes (Signed)
Pulmonary Individual Treatment Plan  Patient Details  Name: Brad Massey MRN: [id number] Date of Birth: [birth year] Referring Provider:   Doristine Devoid Pulmonary Rehab Walk Test from 01/05/2023 in [facility]  Referring Provider [name]       Initial Encounter Date:  Flowsheet Row Pulmonary Rehab Walk Test from 01/05/2023 in [facility]  Date 01/05/23       Visit Diagnosis: Stage 3 severe COPD by GOLD classification (HCC)  Patient's Home Medications on Admission:   Current Outpatient Medications:    albuterol (VENTOLIN HFA) 108 (90 Base) MCG/ACT inhaler, Inhale into the lungs every 6 (six) hours as needed for wheezing or shortness of breath., Disp: , Rfl:    aspirin EC 81 MG tablet, Take 1 tablet (81 mg total) by mouth daily. Swallow whole., Disp: 90 tablet, Rfl: 3   Budeson-Glycopyrrol-Formoterol (BREZTRI AEROSPHERE) 160-9-4.8 MCG/ACT AERO, Inhale 2 puffs into the lungs in the morning and at bedtime., Disp: 10.7 g, Rfl: 5   metoprolol succinate (TOPROL-XL) 100 MG 24 hr tablet, Take 100 mg by mouth daily., Disp: , Rfl:    nitroGLYCERIN (NITROSTAT) 0.4 MG SL tablet, Place 1 tablet (0.4 mg total) under the tongue every 5 (five) minutes as needed for chest pain., Disp: 25 tablet, Rfl: 3   rosuvastatin (CRESTOR) 40 MG tablet, Take 1 tablet (40 mg total) by mouth daily., Disp: 30 tablet, Rfl: 0  Current Facility-Administered Medications:    0.9 %  sodium chloride infusion, 500 mL, Intravenous, Continuous, Armbruster, Willaim Rayas, MD  Past Medical History: Past Medical History:  Diagnosis Date   Cluster headache    COPD (chronic obstructive pulmonary disease) (HCC)    Coronary artery disease    DVT (deep venous thrombosis) (HCC)    Heart murmur    Hypertension     Tobacco Use: Social History   Tobacco Use  Smoking Status Every Day   Current packs/day: 1.00   Average  packs/day: 1 pack/day for 46.7 years (46.7 ttl pk-yrs)   Types: Cigarettes   Start date: 06/25/1976  Smokeless Tobacco Never  Tobacco Comments   smokes 1 ppd    Labs: Review Flowsheet       Latest Ref Rng & Units 04/12/2020  Labs for ITP Cardiac and Pulmonary Rehab  Cholestrol 100 - 199 mg/dL 782   LDL (calc) 0 - 99 mg/dL 57   HDL-C >95 mg/dL 46   Trlycerides 0 - 621 mg/dL 66     Details            Capillary Blood Glucose: No results found for: "GLUCAP"   Pulmonary Assessment Scores:  Pulmonary Assessment Scores     Row Name 01/05/23 1153         ADL UCSD   ADL Phase Entry     SOB Score total 18       CAT Score   CAT Score 12       mMRC Score   mMRC Score 2             UCSD: Self-administered rating of dyspnea associated with activities of daily living (ADLs) 6-point scale (0 = "not at all" to 5 = "maximal or unable to do because of breathlessness")  Scoring Scores range from 0 to 120.  Minimally important difference is 5 units  CAT: CAT can identify the health impairment of COPD patients and is better correlated with disease progression.  CAT has a scoring range of zero to 40. The CAT score is classified into four groups of low (less than 10), medium (10 - 20), high (21-30) and very high (31-40) based on the impact level of disease on health status. A CAT score over 10 suggests significant symptoms.  A worsening CAT score could be explained by an exacerbation, poor medication adherence, poor inhaler technique, or progression of COPD or comorbid conditions.  CAT MCID is 2 points  mMRC: mMRC (Modified Medical Research Council) Dyspnea Scale is used to assess the degree of baseline functional disability in patients of respiratory disease due to dyspnea. No minimal important difference is established. A decrease in score of 1 point or greater is considered a positive change.   Pulmonary Function Assessment:  Pulmonary Function Assessment - 01/05/23 1121        Breath   Bilateral Breath Sounds Clear    Shortness of Breath Yes;Limiting activity             Exercise Target Goals: Exercise Program Goal: Individual exercise prescription set using results from initial 6 min walk test and THRR while considering  patient's activity barriers and safety.   Exercise Prescription Goal: Initial exercise prescription builds to 30-45 minutes a day of aerobic activity, 2-3 days per week.  Home exercise guidelines will be given to patient during program as part of exercise prescription that the participant will acknowledge.  Activity Barriers & Risk Stratification:  Activity Barriers & Cardiac Risk Stratification - 01/05/23 1117       Activity Barriers & Cardiac Risk Stratification   Activity Barriers Deconditioning;Muscular Weakness;Shortness of Breath    Cardiac Risk Stratification Low             6 Minute Walk:  6 Minute Walk     Row Name 01/05/23 1225         6 Minute Walk   Phase Initial     Distance 1382 feet     Walk Time 6 minutes     # of Rest Breaks 0     MPH 2.62     METS 3.31     RPE 8     Perceived Dyspnea  0     VO2 Peak 11.57     Symptoms No     Resting HR 64 bpm     Resting BP 118/70     Resting Oxygen Saturation  95 %     Exercise Oxygen Saturation  during 6 min walk 94 %     Max Ex. HR 88 bpm     Max Ex. BP 120/70     2 Minute Post BP 112/70       Interval HR   1 Minute HR 85     2 Minute HR 88     3 Minute HR 80     4 Minute HR 82     5 Minute HR 79     6 Minute HR 79     2 Minute Post HR 64     Interval Heart Rate? Yes       Interval Oxygen   Interval Oxygen? Yes     Baseline Oxygen Saturation % 95 %     1 Minute Oxygen Saturation % 96 %     1 Minute Liters of Oxygen 0 L     2 Minute Oxygen Saturation % 91 %     2 Minute Liters of Oxygen 0 L     3 Minute  Oxygen Saturation % 95 %     3 Minute Liters of Oxygen 0 L     4 Minute Oxygen Saturation % 96 %     4 Minute Liters of Oxygen 0 L      5 Minute Oxygen Saturation % 95 %     5 Minute Liters of Oxygen 0 L     6 Minute Oxygen Saturation % 96 %     6 Minute Liters of Oxygen 0 L     2 Minute Post Oxygen Saturation % 96 %     2 Minute Post Liters of Oxygen 0 L              Oxygen Initial Assessment:  Oxygen Initial Assessment - 01/05/23 1119       Home Oxygen   Home Oxygen Device None    Sleep Oxygen Prescription None    Home Exercise Oxygen Prescription None    Home Resting Oxygen Prescription None      Initial 6 min Walk   Oxygen Used None      Program Oxygen Prescription   Program Oxygen Prescription None      Intervention   Short Term Goals To learn and understand importance of maintaining oxygen saturations>88%;To learn and demonstrate proper use of respiratory medications;To learn and understand importance of monitoring SPO2 with pulse oximeter and demonstrate accurate use of the pulse oximeter.;To learn and demonstrate proper pursed lip breathing techniques or other breathing techniques.     Long  Term Goals Maintenance of O2 saturations>88%;Compliance with respiratory medication;Verbalizes importance of monitoring SPO2 with pulse oximeter and return demonstration;Exhibits proper breathing techniques, such as pursed lip breathing or other method taught during program session;Demonstrates proper use of MDI's             Oxygen Re-Evaluation:  Oxygen Re-Evaluation     Row Name 01/20/23 4098 02/20/23 1132 03/17/23 0901         Program Oxygen Prescription   Program Oxygen Prescription None None None       Home Oxygen   Home Oxygen Device None None None     Sleep Oxygen Prescription None None None     Home Exercise Oxygen Prescription None None None     Home Resting Oxygen Prescription None None None       Goals/Expected Outcomes   Short Term Goals To learn and understand importance of maintaining oxygen saturations>88%;To learn and demonstrate proper use of respiratory medications;To learn and  understand importance of monitoring SPO2 with pulse oximeter and demonstrate accurate use of the pulse oximeter.;To learn and demonstrate proper pursed lip breathing techniques or other breathing techniques.  To learn and understand importance of maintaining oxygen saturations>88%;To learn and demonstrate proper use of respiratory medications;To learn and understand importance of monitoring SPO2 with pulse oximeter and demonstrate accurate use of the pulse oximeter.;To learn and demonstrate proper pursed lip breathing techniques or other breathing techniques.  To learn and understand importance of maintaining oxygen saturations>88%;To learn and demonstrate proper use of respiratory medications;To learn and understand importance of monitoring SPO2 with pulse oximeter and demonstrate accurate use of the pulse oximeter.;To learn and demonstrate proper pursed lip breathing techniques or other breathing techniques.      Long  Term Goals Maintenance of O2 saturations>88%;Compliance with respiratory medication;Verbalizes importance of monitoring SPO2 with pulse oximeter and return demonstration;Exhibits proper breathing techniques, such as pursed lip breathing or other method taught during program session;Demonstrates proper use of MDI's Maintenance of O2 saturations>88%;Compliance with  respiratory medication;Verbalizes importance of monitoring SPO2 with pulse oximeter and return demonstration;Exhibits proper breathing techniques, such as pursed lip breathing or other method taught during program session;Demonstrates proper use of MDI's Maintenance of O2 saturations>88%;Compliance with respiratory medication;Verbalizes importance of monitoring SPO2 with pulse oximeter and return demonstration;Exhibits proper breathing techniques, such as pursed lip breathing or other method taught during program session;Demonstrates proper use of MDI's     Goals/Expected Outcomes Compliance and understanding of oxygen saturation  monitoring and breathing techniques to decrease shortness of breath. Compliance and understanding of oxygen saturation monitoring and breathing techniques to decrease shortness of breath. Compliance and understanding of oxygen saturation monitoring and breathing techniques to decrease shortness of breath.              Oxygen Discharge (Final Oxygen Re-Evaluation):  Oxygen Re-Evaluation - 03/17/23 0901       Program Oxygen Prescription   Program Oxygen Prescription None      Home Oxygen   Home Oxygen Device None    Sleep Oxygen Prescription None    Home Exercise Oxygen Prescription None    Home Resting Oxygen Prescription None      Goals/Expected Outcomes   Short Term Goals To learn and understand importance of maintaining oxygen saturations>88%;To learn and demonstrate proper use of respiratory medications;To learn and understand importance of monitoring SPO2 with pulse oximeter and demonstrate accurate use of the pulse oximeter.;To learn and demonstrate proper pursed lip breathing techniques or other breathing techniques.     Long  Term Goals Maintenance of O2 saturations>88%;Compliance with respiratory medication;Verbalizes importance of monitoring SPO2 with pulse oximeter and return demonstration;Exhibits proper breathing techniques, such as pursed lip breathing or other method taught during program session;Demonstrates proper use of MDI's    Goals/Expected Outcomes Compliance and understanding of oxygen saturation monitoring and breathing techniques to decrease shortness of breath.             Initial Exercise Prescription:  Initial Exercise Prescription - 01/05/23 1200       Date of Initial Exercise RX and Referring Provider   Date 01/05/23    Referring Provider [name]    Expected Discharge Date 04/02/23      Treadmill   MPH 2.5    Grade 0    Minutes 15      Elliptical   Level 1    Speed 1    Minutes 15      Prescription Details   Frequency (times per week) 2     Duration Progress to 30 minutes of continuous aerobic without signs/symptoms of physical distress      Intensity   THRR 40-80% of Max Heartrate 62-124    Ratings of Perceived Exertion 11-13    Perceived Dyspnea 0-4      Progression   Progression Continue to progress workloads to maintain intensity without signs/symptoms of physical distress.      Resistance Training   Training Prescription Yes    Weight blue bands    Reps 10-15             Perform Capillary Blood Glucose checks as needed.  Exercise Prescription Changes:   Exercise Prescription Changes     Row Name 01/13/23 1500 01/27/23 1400 02/10/23 1500 02/17/23 1500 02/24/23 1500     Response to Exercise   Blood Pressure (Admit) 140/72 126/76 126/74 -- 114/76   Blood Pressure (Exercise) 154/80 166/100 142/58 -- 154/74   Blood Pressure (Exit) 118/72 138/72 138/80 -- 118/58   Heart Rate (Admit) 83  bpm 58 bpm 59 bpm -- 70 bpm   Heart Rate (Exercise) 107 bpm 84 bpm 89 bpm -- 90 bpm   Heart Rate (Exit) 85 bpm 61 bpm 65 bpm -- 73 bpm   Oxygen Saturation (Admit) 95 % 93 % 94 % -- 95 %   Oxygen Saturation (Exercise) 93 % 91 % 93 % -- 93 %   Oxygen Saturation (Exit) 95 % 94 % 93 % -- 94 %   Rating of Perceived Exertion (Exercise) 11 14 13  -- 12   Perceived Dyspnea (Exercise) 1 3 2  -- 1   Duration Progress to 30 minutes of  aerobic without signs/symptoms of physical distress Continue with 30 min of aerobic exercise without signs/symptoms of physical distress. Continue with 30 min of aerobic exercise without signs/symptoms of physical distress. -- Continue with 30 min of aerobic exercise without signs/symptoms of physical distress.   Intensity THRR unchanged THRR unchanged THRR unchanged -- THRR unchanged     Progression   Progression Continue to progress workloads to maintain intensity without signs/symptoms of physical distress. Continue to progress workloads to maintain intensity without signs/symptoms of physical  distress. Continue to progress workloads to maintain intensity without signs/symptoms of physical distress. -- Continue to progress workloads to maintain intensity without signs/symptoms of physical distress.   Average METs -- 4 -- -- --     Paramedic Prescription Yes Yes Yes -- Yes   Weight black bands black bands black bands -- black bands   Reps 10-15 10-15 10-15 -- 10-15   Time 10 Minutes 10 Minutes 10 Minutes -- 10 Minutes     Treadmill   MPH 2 2.5 2.5 -- 2.5   Grade 0 1 1 -- 2   Minutes 15 15 15  -- 15   METs 2.53 3.26 3.26 -- 3.6     Elliptical   Level 1 2 2  -- 3   Speed 1 2 2  -- 3   Minutes 15 15 15  -- 15   METs 4 4 4.2 -- 4.4     Home Exercise Plan   Plans to continue exercise at -- -- -- Home (comment)  walking --   Frequency -- -- -- Add 1 additional day to program exercise sessions. --   Initial Home Exercises Provided -- -- -- 02/17/23 --    Row Name 03/10/23 1500 03/24/23 1100           Response to Exercise   Blood Pressure (Admit) 140/82 136/76      Blood Pressure (Exercise) 136/84 172/80      Blood Pressure (Exit) 118/70 114/76      Heart Rate (Admit) 70 bpm 90 bpm      Heart Rate (Exercise) 86 bpm 104 bpm      Heart Rate (Exit) 63 bpm 88 bpm      Oxygen Saturation (Admit) 93 % 92 %      Oxygen Saturation (Exercise) 91 % 92 %      Oxygen Saturation (Exit) 94 % 94 %      Rating of Perceived Exertion (Exercise) 12 13      Perceived Dyspnea (Exercise) 1 2.5      Duration Continue with 30 min of aerobic exercise without signs/symptoms of physical distress. Continue with 30 min of aerobic exercise without signs/symptoms of physical distress.      Intensity THRR unchanged THRR unchanged        Progression   Progression Continue to progress workloads to maintain  intensity without signs/symptoms of physical distress. Continue to progress workloads to maintain intensity without signs/symptoms of physical distress.        Resistance Training    Training Prescription Yes Yes      Weight black bands black bands      Reps 10-15 10-15      Time 10 Minutes 10 Minutes        Treadmill   MPH 2.8 2.6      Grade 2 2      Minutes 15 15      METs 3.8 3.6        Elliptical   Level 3 4      Speed 3 3      Minutes 15 15      METs 4.5 4.3               Exercise Comments:   Exercise Comments     Row Name 01/13/23 1524 02/17/23 1504         Exercise Comments Pt completed his first day of group exercise. He exercised on the upright elliptical for 15 min, level 1, incline 1, METs 4.0. He then walked on the treadmill for 15 min, speed 2.0, incline 0, METs 2.53. Pt tolerated well without c/o. Performed warm up and cool down with verbal cues. Discussed METs with good reception. Discussed with pt home exercise plan. He is currently walking 30+ min occasionally with his wife. Discussed walking 1-3 non rehab days 30 min or more. He also is a member of [employer] and I encouraged him to do similar machines there if he would like. He wants to be able to hike with his wife.               Exercise Goals and Review:   Exercise Goals     Row Name 01/05/23 1117             Exercise Goals   Increase Physical Activity Yes       Intervention Provide advice, education, support and counseling about physical activity/exercise needs.;Develop an individualized exercise prescription for aerobic and resistive training based on initial evaluation findings, risk stratification, comorbidities and participant's personal goals.       Expected Outcomes Short Term: Attend rehab on a regular basis to increase amount of physical activity.;Long Term: Exercising regularly at least 3-5 days a week.;Long Term: Add in home exercise to make exercise part of routine and to increase amount of physical activity.       Increase Strength and Stamina Yes       Intervention Provide advice, education, support and counseling about physical activity/exercise  needs.;Develop an individualized exercise prescription for aerobic and resistive training based on initial evaluation findings, risk stratification, comorbidities and participant's personal goals.       Expected Outcomes Short Term: Increase workloads from initial exercise prescription for resistance, speed, and METs.;Short Term: Perform resistance training exercises routinely during rehab and add in resistance training at home;Long Term: Improve cardiorespiratory fitness, muscular endurance and strength as measured by increased METs and functional capacity ( )       Able to understand and use rate of perceived exertion (RPE) scale Yes       Intervention Provide education and explanation on how to use RPE scale       Expected Outcomes Short Term: Able to use RPE daily in rehab to express subjective intensity level;Long Term:  Able to use RPE to guide intensity level when exercising independently  Able to understand and use Dyspnea scale Yes       Intervention Provide education and explanation on how to use Dyspnea scale       Expected Outcomes Short Term: Able to use Dyspnea scale daily in rehab to express subjective sense of shortness of breath during exertion;Long Term: Able to use Dyspnea scale to guide intensity level when exercising independently       Knowledge and understanding of Target Heart Rate Range (THRR) Yes       Intervention Provide education and explanation of THRR including how the numbers were predicted and where they are located for reference       Expected Outcomes Short Term: Able to state/look up THRR;Long Term: Able to use THRR to govern intensity when exercising independently;Short Term: Able to use daily as guideline for intensity in rehab       Understanding of Exercise Prescription Yes       Intervention Provide education, explanation, and written materials on patient's individual exercise prescription       Expected Outcomes Short Term: Able to explain program  exercise prescription;Long Term: Able to explain home exercise prescription to exercise independently                Exercise Goals Re-Evaluation :  Exercise Goals Re-Evaluation     Row Name 01/20/23 0849 02/20/23 1130 03/17/23 0858         Exercise Goal Re-Evaluation   Exercise Goals Review Increase Physical Activity;Able to understand and use Dyspnea scale;Understanding of Exercise Prescription;Increase Strength and Stamina;Knowledge and understanding of Target Heart Rate Range (THRR);Able to understand and use rate of perceived exertion (RPE) scale Increase Physical Activity;Able to understand and use Dyspnea scale;Understanding of Exercise Prescription;Increase Strength and Stamina;Knowledge and understanding of Target Heart Rate Range (THRR);Able to understand and use rate of perceived exertion (RPE) scale Increase Physical Activity;Able to understand and use Dyspnea scale;Understanding of Exercise Prescription;Increase Strength and Stamina;Knowledge and understanding of Target Heart Rate Range (THRR);Able to understand and use rate of perceived exertion (RPE) scale     Comments Pt has completed two days of group exercise. He will miss today for a vacation. He is exercising on the upright elliptical for 15 min, level 1, incline 1, METs 4.0. He then is walking on the treadmill for 15 min, speed 2.0, incline 0, METs 2.53. Plan to increase his workload next session as he is tolerating exercise well. He performs warm up and cool down without limitations. Pt has completed 9 days of group exercise, missing 3 sessions for vacation and illness. He is exercising on the upright elliptical for 15 min, level 3, incline 3, METs 4.4. He then is walking on the treadmill for 15 min, speed 2.5, incline 2, METs 3.4. He is progressing and motivated. He has goals to be able to hike well. He is using black bands. Pt has completed 14 days of group exercise, missing 4 sessions for vacation and illness. He is  exercising on the upright elliptical for 15 min, level 3, incline 3, METs 4.3. He then is walking on the treadmill for 15 min, speed 2.5-3 mph, incline 2, METs 3.8. He is progressing and motivated. He has goals to be able to hike well. He is using black bands.     Expected Outcomes Through exercise at rehab and home, the patient will decrease shortness of breath with daily activities and feel confident in carrying out an exercise regimen at home Through exercise at rehab and home, the patient  will decrease shortness of breath with daily activities and feel confident in carrying out an exercise regimen at home Through exercise at rehab and home, the patient will decrease shortness of breath with daily activities and feel confident in carrying out an exercise regimen at home              Discharge Exercise Prescription (Final Exercise Prescription Changes):  Exercise Prescription Changes - 03/24/23 1100       Response to Exercise   Blood Pressure (Admit) 136/76    Blood Pressure (Exercise) 172/80    Blood Pressure (Exit) 114/76    Heart Rate (Admit) 90 bpm    Heart Rate (Exercise) 104 bpm    Heart Rate (Exit) 88 bpm    Oxygen Saturation (Admit) 92 %    Oxygen Saturation (Exercise) 92 %    Oxygen Saturation (Exit) 94 %    Rating of Perceived Exertion (Exercise) 13    Perceived Dyspnea (Exercise) 2.5    Duration Continue with 30 min of aerobic exercise without signs/symptoms of physical distress.    Intensity THRR unchanged      Progression   Progression Continue to progress workloads to maintain intensity without signs/symptoms of physical distress.      Resistance Training   Training Prescription Yes    Weight black bands    Reps 10-15    Time 10 Minutes      Treadmill   MPH 2.6    Grade 2    Minutes 15    METs 3.6      Elliptical   Level 4    Speed 3    Minutes 15    METs 4.3             Nutrition:  Target Goals: Understanding of nutrition guidelines, daily  intake of sodium 1500mg , cholesterol 200mg , calories 30% from fat and 7% or less from saturated fats, daily to have 5 or more servings of fruits and vegetables.  Biometrics:  Pre Biometrics - 01/05/23 1258       Pre Biometrics   Grip Strength 40 kg              Nutrition Therapy Plan and Nutrition Goals:  Nutrition Therapy & Goals - 03/10/23 1443       Nutrition Therapy   Diet General Healthy Diet    Drug/Food Interactions Statins/Certain Fruits      Personal Nutrition Goals   Nutrition Goal Patient to improve diet quality by using the plate method as a guide for meal planning to include lean protein/plant protein, fruits, vegetables, whole grains, nonfat dairy as part of a well-balanced diet.   goal in progress.   Comments Brad [facility]sey reports no nutrition concerns/questions at this time. He reports eating a wide variety of foods with increased focus on fiber intake related to recent hemrroid surgery. 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 He reports normal bowel movements with consistent dietary fiber intake. He remains pre-contemplative toward smoking cessation; counseled by staff and resouces given on 9/26. He is up 3.7# since starting with our program. Patient will benefit from participation in pulmonary rehab for nutrition, exercise, and lifestyle modification.      Intervention Plan   Intervention Prescribe, educate and counsel regarding individualized specific dietary modifications aiming towards targeted core components such as weight, hypertension, lipid management, diabetes, heart failure and other comorbidities.;Nutrition handout(s) given to patient.    Expected Outcomes Short Term Goal:  Understand basic principles of dietary content, such as calories, fat, sodium, cholesterol and nutrients.;Long Term Goal: Adherence to prescribed nutrition plan.             Nutrition Assessments:  Nutrition  Assessments - 01/13/23 1510       Rate Your Plate Scores   Pre Score 48            MEDIFICTS Score Key: >=70 Need to make dietary changes  40-70 Heart Healthy Diet <= 40 Therapeutic Level Cholesterol Diet  Flowsheet Row PULMONARY REHAB CHRONIC OBSTRUCTIVE PULMONARY DISEASE from 01/13/2023 in [facility]  Picture Your Plate Total Score on Admission 48      Picture Your Plate Scores: <62 Unhealthy dietary pattern with much room for improvement. 41-50 Dietary pattern unlikely to meet recommendations for good health and room for improvement. 51-60 More healthful dietary pattern, with some room for improvement.  >60 Healthy dietary pattern, although there may be some specific behaviors that could be improved.    Nutrition Goals Re-Evaluation:  Nutrition Goals Re-Evaluation     Row Name 01/13/23 1406 02/10/23 1444 03/10/23 1443         Goals   Current Weight 185 lb 13.6 oz (84.3 kg) 188 lb 0.8 oz (85.3 kg) 188 lb 7.9 oz (85.5 kg)     Comment labs not available from outside provider [facility]. Per Patient labs WNL labs not available from outside provider [facility]. Per Patient labs WNL labs not available from outside provider [facility]. Per Patient labs WNL     Expected Outcome Brad Massey reports no nutrition concerns/questions at this time. He reports eating a wide variety of foods with increased focus on fiber intake related to recent hemrroid surgery. 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 He reports normal bowel movements with consistent dietary fiber intake. He remains pre-contemplative toward smoking cessation. Patient will benefit from participation in intensive cardiac rehab for nutrition, exercise, and lifestyle modification. Brad Massey reports no nutrition concerns/questions at this time. He reports eating a wide variety of foods  with increased focus on fiber intake related to recent hemrroid surgery. 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 He reports normal bowel movements with consistent dietary fiber intake. He remains pre-contemplative toward smoking cessation; counseled by staff and resouces given on 9/26. He is up 3.3# since starting with our program. Patient will benefit from participation in intensive cardiac rehab for nutrition, exercise, and lifestyle modification. Brad Massey reports no nutrition concerns/questions at this time. He reports eating a wide variety of foods with increased focus on fiber intake related to recent hemrroid surgery. 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 He reports normal bowel movements with consistent dietary fiber intake. He remains pre-contemplative toward smoking cessation; counseled by staff and resouces given on 9/26. He is up 3.7# since starting with our program. Patient will benefit from participation in pulmonary rehab for nutrition, exercise, and lifestyle modification.              Nutrition Goals Discharge (Final Nutrition Goals Re-Evaluation):  Nutrition Goals Re-Evaluation - 03/10/23 1443       Goals   Current Weight 188 lb 7.9 oz (85.5 kg)    Comment labs not available from outside provider [facility]. Per Patient labs WNL  Expected Outcome Carron reports no nutrition concerns/questions at this time. He reports eating a wide variety of foods with increased focus on fiber intake related to recent hemrroid surgery. 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 He reports normal bowel movements with consistent dietary fiber intake. He remains pre-contemplative toward smoking cessation; counseled by staff and resouces given on 9/26. He is up 3.7#  since starting with our program. Patient will benefit from participation in pulmonary rehab for nutrition, exercise, and lifestyle modification.             Psychosocial: Target Goals: Acknowledge presence or absence of significant depression and/or stress, maximize coping skills, provide positive support system. Participant is able to verbalize types and ability to use techniques and skills needed for reducing stress and depression.  Initial Review & Psychosocial Screening:  Initial Psych Review & Screening - 01/05/23 1112       Initial Review   Current issues with None Identified      Family Dynamics   Good Support System? Yes      Barriers   Psychosocial barriers to participate in program There are no identifiable barriers or psychosocial needs.      Screening Interventions   Interventions Encouraged to exercise             Quality of Life Scores:  Scores of 19 and below usually indicate a poorer quality of life in these areas.  A difference of  2-3 points is a clinically meaningful difference.  A difference of 2-3 points in the total score of the Quality of Life Index has been associated with significant improvement in overall quality of life, self-image, physical symptoms, and general health in studies assessing change in quality of life.  PHQ-9: Review Flowsheet       01/05/2023  Depression screen PHQ 2/9  Decreased Interest 0  Down, Depressed, Hopeless 0  PHQ - 2 Score 0  Altered sleeping 1  Tired, decreased energy 1  Change in appetite 0  Feeling bad or failure about yourself  0  Trouble concentrating 0  Moving slowly or fidgety/restless 0  Suicidal thoughts 0  PHQ-9 Score 2  Difficult doing work/chores Not difficult at all    Details           Interpretation of Total Score  Total Score Depression Severity:  1-4 = Minimal depression, 5-9 = Mild depression, 10-14 = Moderate depression, 15-19 = Moderately severe depression, 20-27 = Severe depression    Psychosocial Evaluation and Intervention:  Psychosocial Evaluation - 01/05/23 1113       Psychosocial Evaluation & Interventions   Interventions Encouraged to exercise with the program and follow exercise prescription    Comments Pleasant denies any psychosocial barriers or concerns at this time.    Expected Outcomes For Brad Massey to participate in PR free of any psychosocial barriers or concerns    Continue Psychosocial Services  No Follow up required             Psychosocial Re-Evaluation:  Psychosocial Re-Evaluation     Row Name 01/21/23 1047 02/20/23 1456 03/20/23 1440         Psychosocial Re-Evaluation   Current issues with None Identified None Identified None Identified     Comments Brad Massey denies any psychosocial issues or problems at time of eval. He started the program on 9/17 and has attended 2 classes so far. At time of reevaluation Brad Massey still denies any psychosocial needs  or barriers. He states he has good support from his wife and enjoys camping for stress relief. Psychosocial monthly re-evaluation is as follows: Brad Massey still denies any psychosocial needs or barriers. He states he has good support from his wife and enjoys camping for stress relief. Brad Massey also stated he is looking forward to the holidays with his family.     Expected Outcomes For Markeise to continue to attend PR without any psychosocial barriers or concerns For Brad Massey to continue to attend PR without any psychosocial barriers or concerns For Brad Massey to continue to attend PR without any psychosocial barriers or concerns     Interventions -- Encouraged to attend Pulmonary Rehabilitation for the exercise Encouraged to attend Pulmonary Rehabilitation for the exercise     Continue Psychosocial Services  No Follow up required No Follow up required No Follow up required              Psychosocial Discharge (Final Psychosocial Re-Evaluation):  Psychosocial Re-Evaluation - 03/20/23 1440       Psychosocial Re-Evaluation    Current issues with None Identified    Comments Psychosocial monthly re-evaluation is as follows: Brad Massey still denies any psychosocial needs or barriers. He states he has good support from his wife and enjoys camping for stress relief. Brad Massey also stated he is looking forward to the holidays with his family.    Expected Outcomes For Brad Massey to continue to attend PR without any psychosocial barriers or concerns    Interventions Encouraged to attend Pulmonary Rehabilitation for the exercise    Continue Psychosocial Services  No Follow up required             Education: Education Goals: Education classes will be provided on a weekly basis, covering required topics. Participant will state understanding/return demonstration of topics presented.  Learning Barriers/Preferences:  Learning Barriers/Preferences - 01/05/23 1114       Learning Barriers/Preferences   Learning Barriers Sight   wears glasses, needs cataract surgery   Learning Preferences Group Instruction;Individual Instruction;Skilled Demonstration             Education Topics: Know Your Numbers Group instruction that is supported by a PowerPoint presentation. Instructor discusses importance of knowing and understanding resting, exercise, and post-exercise oxygen saturation, heart rate, and blood pressure. Oxygen saturation, heart rate, blood pressure, rating of perceived exertion, and dyspnea are reviewed along with a normal range for these values.  Flowsheet Row PULMONARY REHAB CHRONIC OBSTRUCTIVE PULMONARY DISEASE from 01/29/2023 in [facility], [facility], [facility]  Date 01/29/23  Educator EP  Instruction Review Code 1- Verbalizes Understanding       Exercise for the Pulmonary Patient Group instruction that is supported by a PowerPoint presentation. Instructor discusses benefits of exercise, core components of exercise, frequency, duration, and intensity of an exercise routine, importance  of utilizing pulse oximetry during exercise, safety while exercising, and options of places to exercise outside of rehab.  Flowsheet Row PULMONARY REHAB CHRONIC OBSTRUCTIVE PULMONARY DISEASE from 01/22/2023 in [facility], [facility], & Lung Health  Date 01/22/23  Educator EP  Instruction Review Code 1- Verbalizes Understanding       MET Level  Group instruction provided by PowerPoint, verbal discussion, and written material to support subject matter. Instructor reviews what METs are and how to increase METs.  Flowsheet Row PULMONARY REHAB CHRONIC OBSTRUCTIVE PULMONARY DISEASE from 03/19/2023 in [facility], [facility]  Date 03/19/23  Educator EP  Instruction Review Code 1- Verbalizes Understanding       Pulmonary Medications Verbally interactive group education provided by instructor with focus on inhaled medications and proper administration. Flowsheet Row PULMONARY REHAB CHRONIC OBSTRUCTIVE PULMONARY DISEASE from 01/15/2023 in [facility] for Heart, Vascular, & Lung Health  Date 01/15/23  Educator RT  Instruction Review Code 1- Verbalizes Understanding       Anatomy and Physiology of the Respiratory System Group instruction provided by PowerPoint, verbal discussion, and written material to support subject matter. Instructor reviews respiratory cycle and anatomical components of the respiratory system and their functions. Instructor also reviews differences in obstructive and restrictive respiratory diseases with examples of each.    Oxygen Safety Group instruction provided by PowerPoint, verbal discussion, and written material to support subject matter. There is an overview of "What is Oxygen" and "Why do we need it".  Instructor also reviews how to create a safe environment for oxygen use, the importance of using oxygen as prescribed, and the risks of noncompliance. There is a  brief discussion on traveling with oxygen and resources the patient may utilize.   Oxygen Use Group instruction provided by PowerPoint, verbal discussion, and written material to discuss how supplemental oxygen is prescribed and different types of oxygen supply systems. Resources for more information are provided.  Flowsheet Row PULMONARY REHAB CHRONIC OBSTRUCTIVE PULMONARY DISEASE from 02/12/2023 in [facility] for Heart, Vascular, & Lung Health  Date 02/12/23  Educator RT  Instruction Review Code 1- Verbalizes Understanding       Breathing Techniques Group instruction that is supported by demonstration and informational handouts. Instructor discusses the benefits of pursed lip and diaphragmatic breathing and detailed demonstration on how to perform both.  Flowsheet Row PULMONARY REHAB CHRONIC OBSTRUCTIVE PULMONARY DISEASE from 02/19/2023 in [facility], & Lung Health  Date 02/19/23  Educator RN  Instruction Review Code 1- Verbalizes Understanding        Risk Factor Reduction Group instruction that is supported by a PowerPoint presentation. Instructor discusses the definition of a risk factor, different risk factors for pulmonary disease, and how the heart and lungs work together. Flowsheet Row PULMONARY REHAB CHRONIC OBSTRUCTIVE PULMONARY DISEASE from 03/12/2023 in [facility] Heart, Vascular, & Lung Health  Date 03/12/23  Educator EP  Instruction Review Code 1- Verbalizes Understanding       Pulmonary Diseases Group instruction provided by PowerPoint, verbal discussion, and written material to support subject matter. Instructor gives an overview of the different type of pulmonary diseases. There is also a discussion on risk factors and symptoms as well as ways to manage the diseases.   Stress and Energy Conservation Group instruction provided by PowerPoint, verbal discussion, and  written material to support subject matter. Instructor gives an overview of stress and the impact it can have on the body. Instructor also reviews ways to reduce stress. There is also a discussion on energy conservation and ways to conserve energy throughout the day. Flowsheet Row PULMONARY REHAB CHRONIC OBSTRUCTIVE PULMONARY DISEASE from 02/26/2023 in [facility], & Lung Health  Date 02/26/23  Educator RN  Instruction Review Code 1- Verbalizes Understanding       Warning Signs and Symptoms Group instruction provided by PowerPoint, verbal discussion, and written material to support subject matter. Instructor reviews warning signs and symptoms of stroke, heart attack, cold and flu. Instructor also reviews ways  to prevent the spread of infection. Flowsheet Row PULMONARY REHAB CHRONIC OBSTRUCTIVE PULMONARY DISEASE from 03/05/2023 in [facility] for Heart, Vascular, & Lung Health  Date 03/05/23  Educator RN  Instruction Review Code 1- Verbalizes Understanding       Other Education Group or individual verbal, written, or video instructions that support the educational goals of the pulmonary rehab program.    Knowledge Questionnaire Score:  Knowledge Questionnaire Score - 01/05/23 1153       Knowledge Questionnaire Score   Pre Score 14/18             Core Components/Risk Factors/Patient Goals at Admission:  Personal Goals and Risk Factors at Admission - 01/05/23 1115       Core Components/Risk Factors/Patient Goals on Admission    Weight Management Weight Loss;Yes    Intervention Weight Management: Develop a combined nutrition and exercise program designed to reach desired caloric intake, while maintaining appropriate intake of nutrient and fiber, sodium and fats, and appropriate energy expenditure required for the weight goal.;Weight Management: Provide education and appropriate resources to help participant work  on and attain dietary goals.;Weight Management/Obesity: Establish reasonable short term and long term weight goals.;Obesity: Provide education and appropriate resources to help participant work on and attain dietary goals.    Expected Outcomes Short Term: Continue to assess and modify interventions until short term weight is achieved;Long Term: Adherence to nutrition and physical activity/exercise program aimed toward attainment of established weight goal;Weight Loss: Understanding of general recommendations for a balanced deficit meal plan, which promotes 1-2 lb weight loss per week and includes a negative energy balance of [phone] kcal/d;Understanding recommendations for meals to include 15-35% energy as protein, 25-35% energy from fat, 35-60% energy from carbohydrates, less than 200mg  of dietary cholesterol, 20-35 gm of total fiber daily;Understanding of distribution of calorie intake throughout the day with the consumption of 4-5 meals/snacks    Tobacco Cessation Yes    Number of packs per day 1ppd    Intervention Assist the participant in steps to quit. Provide individualized education and counseling about committing to Tobacco Cessation, relapse prevention, and pharmacological support that can be provided by physician.;Education officer, environmental, assist with locating and accessing local/national Quit Smoking programs, and support quit date choice.    Expected Outcomes Short Term: Will demonstrate readiness to quit, by selecting a quit date.;Short Term: Will quit all tobacco product use, adhering to prevention of relapse plan.;Long Term: Complete abstinence from all tobacco products for at least 12 months from quit date.    Improve shortness of breath with ADL's Yes    Intervention Provide education, individualized exercise plan and daily activity instruction to help decrease symptoms of SOB with activities of daily living.    Expected Outcomes Short Term: Improve cardiorespiratory fitness to  achieve a reduction of symptoms when performing ADLs;Long Term: Be able to perform more ADLs without symptoms or delay the onset of symptoms    Increase knowledge of respiratory medications and ability to use respiratory devices properly  Yes    Intervention Provide education and demonstration as needed of appropriate use of medications, inhalers, and oxygen therapy.    Expected Outcomes Short Term: Achieves understanding of medications use. Understands that oxygen is a medication prescribed by physician. Demonstrates appropriate use of inhaler and oxygen therapy.;Long Term: Maintain appropriate use of medications, inhalers, and oxygen therapy.             Core Components/Risk Factors/Patient Goals Review:   Goals and Risk  Factor Review     Row Name 01/21/23 1048 01/22/23 1540 02/17/23 1507 02/20/23 1500 03/17/23 0902     Core Components/Risk Factors/Patient Goals Review   Personal Goals Review Weight Management/Obesity;Tobacco Cessation;Improve shortness of breath with ADL's;Develop more efficient breathing techniques such as purse lipped breathing and diaphragmatic breathing and practicing self-pacing with activity. Tobacco Cessation Tobacco Cessation Weight Management/Obesity;Tobacco Cessation;Improve shortness of breath with ADL's;Develop more efficient breathing techniques such as purse lipped breathing and diaphragmatic breathing and practicing self-pacing with activity. Tobacco Cessation   Review Unable to assess goals. Jaycub has completed 2 exercise sessions. Discussed with pt readiness for smoking cessation. He sts that he thinks about quitting daily but he has never actually made any actions steps to quit. Gave pt resources to view and consider the effects of smoking. Encouraged pt to consider action in quitting. He agreed. Will f/u. Spoke with pt again regarding his readiness to quit smoking. He has not looked at the resources yet and is still contemplative. Discussed with pt the risks of  smoking regarding his health. He would like to be able to hike more and discussed smoking's impact on exertional ability. Highly encouraged pt to set a concrete quit date. He is not interested in Chantix but possibly nicotine patches. Pt discussed with a fellow pt as well. Goal not met for weight loss. Brad Massey states he would like to lose weight but is up ~4# since starting. He has spoken to our dietician but states he has a good understanding of what he needs to do. Goal progressing for decreasing his shortness of breath with ADLs. Carry's oxygen has maintained >88% on room air with exertion. He has increased both his workload and METs while maintaining his oxygen saturations. Goal progressing on developing more efficient breathing techniques such as purse lipped breathing and diaphragmatic breathing; and practicing self-pacing with activity. Brad Massey needs to be reminded to initiate PLB without staff interference while exercising. He knows how to listen to his body and pace himself when he becomes short of breath. Goal not met for tabacco cessation. Brad Massey is not ready to quit smoking at this time. Resources have been provided and staff have assisted in setting up an action plan and quit date. We will continue to monitor and follow along with [facility] He will continue to benefit from participation in PR for nutrition, education, exercise, and lifestyle modification. Intermittent discussions have not led to pt setting a quit date.   Expected Outcomes For Tayten to lose weight, quit smoking, improve his shortness of breath with ADLs, and develop more efficient breathing techniques and learn how to self pace For pt to choose an action plan and set a quit date. For pt to choose an action plan and set a quit date. For Brad Massey to lose weight, quit smoking, improve his shortness of breath with ADLs, and develop more efficient breathing techniques and learn how to self pace To set a quit date for smoking cessation.    Row Name 03/20/23  1443             Core Components/Risk Factors/Patient Goals Review   Personal Goals Review Weight Management/Obesity;Tobacco Cessation;Improve shortness of breath with ADL's;Develop more efficient breathing techniques such as purse lipped breathing and diaphragmatic breathing and practicing self-pacing with activity.       Review Goal not met for weight loss. Kyzar states he would like to lose weight but is up ~4# since starting. He has spoken to our dietician but states he has a good  understanding of what he needs to do. He has not committed to making changes. Goal progressing for decreasing his shortness of breath with ADLs. Carry's oxygen has maintained >88% on room air with exertion. He has increased both his workload and METs while maintaining his oxygen saturations. Goal met on developing more efficient breathing techniques such as purse lipped breathing and diaphragmatic breathing; and practicing self-pacing with activity. Sid can initiate PLB without staff interference while exercising. He knows how to listen to his body and pace himself when he becomes short of breath. Goal not met for tobacco cessation. Izais is not ready to quit smoking at this time. Resources have been provided and staff have assisted in setting up an action plan and quit date. We will continue to monitor and follow along with [facility] on his goals. He will continue to benefit from participation in PR for nutrition, education, exercise, and lifestyle modification.       Expected Outcomes For Brad Massey to lose weight, quit smoking, and improve his shortness of breath with ADLs                Core Components/Risk Factors/Patient Goals at Discharge (Final Review):   Goals and Risk Factor Review - 03/20/23 1443       Core Components/Risk Factors/Patient Goals Review   Personal Goals Review Weight Management/Obesity;Tobacco Cessation;Improve shortness of breath with ADL's;Develop more efficient breathing techniques such as purse  lipped breathing and diaphragmatic breathing and practicing self-pacing with activity.    Review Goal not met for weight loss. Burk states he would like to lose weight but is up ~4# since starting. He has spoken to our dietician but states he has a good understanding of what he needs to do. He has not committed to making changes. Goal progressing for decreasing his shortness of breath with ADLs. Carry's oxygen has maintained >88% on room air with exertion. He has increased both his workload and METs while maintaining his oxygen saturations. Goal met on developing more efficient breathing techniques such as purse lipped breathing and diaphragmatic breathing; and practicing self-pacing with activity. Markail can initiate PLB without staff interference while exercising. He knows how to listen to his body and pace himself when he becomes short of breath. Goal not met for tobacco cessation. Joeseph is not ready to quit smoking at this time. Resources have been provided and staff have assisted in setting up an action plan and quit date. We will continue to monitor and follow along with [facility] on his goals. He will continue to benefit from participation in PR for nutrition, education, exercise, and lifestyle modification.    Expected Outcomes For Camar to lose weight, quit smoking, and improve his shortness of breath with ADLs             ITP Comments: Pt is making expected progress toward Pulmonary Rehab goals after completing 17 session(s). Recommend continued exercise, life style modification, education, and utilization of breathing techniques to increase stamina and strength, while also decreasing shortness of breath with exertion.  Dr. Mechele Collin is Medical Director for Pulmonary Rehab at [facility].

## 2023-03-30 ENCOUNTER — Other Ambulatory Visit: Payer: Self-pay | Admitting: Cardiovascular Disease

## 2023-03-31 ENCOUNTER — Encounter (HOSPITAL_COMMUNITY)
Admission: RE | Admit: 2023-03-31 | Discharge: 2023-03-31 | Disposition: A | Discharge status: Home / Self Care | Payer: Medicare Other | Source: Ambulatory Visit | Attending: Pulmonary Disease | Admitting: Pulmonary Disease

## 2023-03-31 DIAGNOSIS — J449 Chronic obstructive pulmonary disease, unspecified: Secondary | ICD-10-CM | POA: Insufficient documentation

## 2023-03-31 NOTE — Progress Notes (Signed)
Daily Session Note  Patient Details  Name: Brad Massey MRN: [id number] Date of Birth: April 30, 1955 Referring Provider:   Doristine Devoid Pulmonary Rehab Walk Test from 01/05/2023 in [facility]  Referring Provider [name]       Encounter Date: 03/31/2023  Check In:  Session Check In - 03/31/23 1428       Check-In   Supervising physician immediately available to respond to emergencies [facility] MD immediately available    Physician(s) Eligha Bridegroom, NP    Location [facility]    Staff Present Raford Pitcher, MS, ACSM-CEP, Exercise Physiologist;Mary Gerre Scull, RN, BSN;Aspasia Rude BS, ACSM-CEP, Exercise Physiologist;David Winfield, MS, ACSM-CEP, CCRP, Exercise Physiologist    Virtual Visit No    Medication changes reported     No    Fall or balance concerns reported    No    Tobacco Cessation No Change    Warm-up and Cool-down Performed as group-led instruction    Resistance Training Performed Yes    VAD Patient? No    PAD/SET Patient? No      Pain Assessment   Currently in Pain? No/denies    Pain Score 0-No pain    Multiple Pain Sites No             Capillary Blood Glucose: No results found for this or any previous visit (from the past 24 hour(s)).    Social History   Tobacco Use  Smoking Status Every Day   Current packs/day: 1.00   Average packs/day: 1 pack/day for 46.8 years (46.8 ttl pk-yrs)   Types: Cigarettes   Start date: 06/25/1976  Smokeless Tobacco Never  Tobacco Comments   smokes 1 ppd    Goals Met:  Independence with exercise equipment Exercise tolerated well No report of concerns or symptoms today Strength training completed today  Goals Unmet:  Not Applicable  Comments: Service time is from 1321 to 1435.    Dr. Mechele Collin is Medical Director for Pulmonary Rehab at [facility].

## 2023-04-01 ENCOUNTER — Other Ambulatory Visit (HOSPITAL_BASED_OUTPATIENT_CLINIC_OR_DEPARTMENT_OTHER): Payer: Self-pay

## 2023-04-02 ENCOUNTER — Encounter (HOSPITAL_COMMUNITY)
Admission: RE | Admit: 2023-04-02 | Discharge: 2023-04-02 | Disposition: A | Discharge status: Home / Self Care | Payer: Medicare Other | Source: Ambulatory Visit | Attending: Pulmonary Disease | Admitting: Pulmonary Disease

## 2023-04-02 DIAGNOSIS — J449 Chronic obstructive pulmonary disease, unspecified: Secondary | ICD-10-CM | POA: Diagnosis not present

## 2023-04-02 NOTE — Progress Notes (Signed)
Daily Session Note  Patient Details  Name: Brad Massey MRN: [id number] Date of Birth: [birth year] Referring Provider:   Doristine Devoid Pulmonary Rehab Walk Test from [facility]/05/2023 in [facility]  Referring Provider [name]       Encounter Date: 04/02/2023  Check In:  Session Check In - 04/02/23 1348       Check-In   Supervising physician immediately available to respond to emergencies [facility] MD immediately available    Physician(s) Edd Fabian, NP    Location [facility]    Staff Present Raford Pitcher, MS, ACSM-CEP, Exercise Physiologist;Mary Gerre Scull, RN, BSN;Randi Idelle Crouch BS, ACSM-CEP, Exercise Physiologist;Aara Jacquot Katrinka Blazing, RT    Virtual Visit No    Medication changes reported     No    Fall or balance concerns reported    No    Tobacco Cessation No Change    Warm-up and Cool-down Performed as group-led instruction    Resistance Training Performed Yes    VAD Patient? No    PAD/SET Patient? No      Pain Assessment   Currently in Pain? No/denies    Multiple Pain Sites No             Capillary Blood Glucose: No results found for this or any previous visit (from the past 24 hour(s)).    Social History   Tobacco Use  Smoking Status Every Day   Current packs/day: 1.00   Average packs/day: 1 pack/day for 46.8 years (46.8 ttl pk-yrs)   Types: Cigarettes   Start date: 06/25/1976  Smokeless Tobacco Never  Tobacco Comments   smokes 1 ppd    Goals Met:  Proper associated with RPD/PD & O2 Sat Independence with exercise equipment Exercise tolerated well No report of concerns or symptoms today Strength training completed today  Goals Unmet:  Not Applicable  Comments: Service time is from 1320 to 1440.    Dr. Mechele Collin is Medical Director for Pulmonary Rehab at [facility].

## 2023-04-06 NOTE — Progress Notes (Signed)
Discharge Progress Report  Patient Details  Name: Brad Massey MRN: [id number] Date of Birth: [birth year] Referring Provider:   Doristine Devoid Pulmonary Rehab Walk Test from 01/05/2023 in [facility]  Referring Provider [name]        Number of Visits: 31  Reason for Discharge:  Patient reached a stable level of exercise. Patient independent in their exercise. Patient has met program and personal goals.  Smoking History:  Social History   Tobacco Use  Smoking Status Every Day   Current packs/day: 1.00   Average packs/day: 1 pack/day for 46.8 years (46.8 ttl pk-yrs)   Types: Cigarettes   Start date: 06/25/1976  Smokeless Tobacco Never  Tobacco Comments   smokes 1 ppd    Diagnosis:  Stage 3 severe COPD by GOLD classification [facility])  ADL UCSD:  Pulmonary Assessment Scores     Row Name 01/05/23 1153 03/31/23 1502 04/02/23 1508     ADL UCSD   ADL Phase Entry Exit Exit   SOB Score total 18 12 --     CAT Score   CAT Score 12 7 --     mMRC Score   mMRC Score 2 -- 1            Initial Exercise Prescription:  Initial Exercise Prescription - 01/05/23 1200       Date of Initial Exercise RX and Referring Provider   Date 01/05/23    Referring Provider [name]    Expected Discharge Date 04/02/23      Treadmill   MPH 2.5    Grade 0    Minutes 15      Elliptical   Level 1    Speed 1    Minutes 15      Prescription Details   Frequency (times per week) 2    Duration Progress to 30 minutes of continuous aerobic without signs/symptoms of physical distress      Intensity   THRR 40-80% of Max Heartrate 62-124    Ratings of Perceived Exertion 11-13    Perceived Dyspnea 0-4      Progression   Progression Continue to progress workloads to maintain intensity without signs/symptoms of physical distress.      Resistance Training   Training Prescription Yes    Weight blue bands    Reps 10-15              Discharge Exercise Prescription (Final Exercise Prescription Changes):  Exercise Prescription Changes - 03/24/23 1100       Response to Exercise   Blood Pressure (Admit) 136/76    Blood Pressure (Exercise) 172/80    Blood Pressure (Exit) 114/76    Heart Rate (Admit) 90 bpm    Heart Rate (Exercise) 104 bpm    Heart Rate (Exit) 88 bpm    Oxygen Saturation (Admit) 92 %    Oxygen Saturation (Exercise) 92 %    Oxygen Saturation (Exit) 94 %    Rating of Perceived Exertion (Exercise) 13    Perceived Dyspnea (Exercise) 2.5    Duration Continue with 30 min of aerobic exercise without signs/symptoms of physical distress.    Intensity THRR unchanged      Progression   Progression Continue to progress workloads to maintain intensity without signs/symptoms of physical distress.      Resistance Training   Training Prescription Yes    Weight black bands    Reps 10-15    Time 10 Minutes  Treadmill   MPH 2.6    Grade 2    Minutes 15    METs 3.6      Elliptical   Level 4    Speed 3    Minutes 15    METs 4.3             Functional Capacity:  6 Minute Walk     Row Name 01/05/23 1225 04/02/23 1504       6 Minute Walk   Phase Initial Discharge    Distance 1382 feet 1550 feet    Distance % Change -- 12.16 %    Distance Feet Change -- 168 ft    Walk Time 6 minutes 6 minutes    # of Rest Breaks 0 0    MPH 2.62 2.94    METS 3.31 3.66    RPE 8 11    Perceived Dyspnea  0 1    VO2 Peak 11.57 12.8    Symptoms No No    Resting HR 64 bpm 60 bpm    Resting BP 118/70 130/78    Resting Oxygen Saturation  95 % 95 %    Exercise Oxygen Saturation  during 6 min walk 94 % 92 %    Max Ex. HR 88 bpm 81 bpm    Max Ex. BP 120/70 140/86    2 Minute Post BP 112/70 140/80      Interval HR   1 Minute HR 85 76    2 Minute HR 88 79    3 Minute HR 80 81    4 Minute HR 82 81    5 Minute HR 79 81    6 Minute HR 79 80    2 Minute Post HR 64 64    Interval Heart Rate? Yes --       Interval Oxygen   Interval Oxygen? Yes --    Baseline Oxygen Saturation % 95 % 95 %    1 Minute Oxygen Saturation % 96 % 94 %    1 Minute Liters of Oxygen 0 L 0 L    2 Minute Oxygen Saturation % 91 % 93 %    2 Minute Liters of Oxygen 0 L 0 L    3 Minute Oxygen Saturation % 95 % 92 %    3 Minute Liters of Oxygen 0 L 0 L    4 Minute Oxygen Saturation % 96 % 93 %    4 Minute Liters of Oxygen 0 L 0 L    5 Minute Oxygen Saturation % 95 % 93 %    5 Minute Liters of Oxygen 0 L 0 L    6 Minute Oxygen Saturation % 96 % 94 %    6 Minute Liters of Oxygen 0 L 0 L    2 Minute Post Oxygen Saturation % 96 % 95 %    2 Minute Post Liters of Oxygen 0 L 0 L             Psychological, QOL, Others - Outcomes: PHQ 2/9:    03/31/2023    3:02 PM 01/05/2023   10:56 AM  Depression screen PHQ 2/9  Decreased Interest 0 0  Down, Depressed, Hopeless 0 0  PHQ - 2 Score 0 0  Altered sleeping 0 1  Tired, decreased energy 0 1  Change in appetite 0 0  Feeling bad or failure about yourself  0 0  Trouble concentrating 0 0  Moving slowly or fidgety/restless 0 0  Suicidal thoughts 0 0  PHQ-9 Score 0 2  Difficult doing work/chores Not difficult at all Not difficult at all    Quality of Life:   Personal Goals: Goals established at orientation with interventions provided to work toward goal.  Personal Goals and Risk Factors at Admission - 01/05/23 1115       Core Components/Risk Factors/Patient Goals on Admission    Weight Management Weight Loss;Yes    Intervention Weight Management: Develop a combined nutrition and exercise program designed to reach desired caloric intake, while maintaining appropriate intake of nutrient and fiber, sodium and fats, and appropriate energy expenditure required for the weight goal.;Weight Management: Provide education and appropriate resources to help participant work on and attain dietary goals.;Weight Management/Obesity: Establish reasonable short term and long term  weight goals.;Obesity: Provide education and appropriate resources to help participant work on and attain dietary goals.    Expected Outcomes Short Term: Continue to assess and modify interventions until short term weight is achieved;Long Term: Adherence to nutrition and physical activity/exercise program aimed toward attainment of established weight goal;Weight Loss: Understanding of general recommendations for a balanced deficit meal plan, which promotes 1-2 lb weight loss per week and includes a negative energy balance of [phone] kcal/d;Understanding recommendations for meals to include 15-35% energy as protein, 25-35% energy from fat, 35-60% energy from carbohydrates, less than 200mg  of dietary cholesterol, 20-35 gm of total fiber daily;Understanding of distribution of calorie intake throughout the day with the consumption of 4-5 meals/snacks    Tobacco Cessation Yes    Number of packs per day 1ppd    Intervention Assist the participant in steps to quit. Provide individualized education and counseling about committing to Tobacco Cessation, relapse prevention, and pharmacological support that can be provided by physician.;Education officer, environmental, assist with locating and accessing local/national Quit Smoking programs, and support quit date choice.    Expected Outcomes Short Term: Will demonstrate readiness to quit, by selecting a quit date.;Short Term: Will quit all tobacco product use, adhering to prevention of relapse plan.;Long Term: Complete abstinence from all tobacco products for at least 12 months from quit date.    Improve shortness of breath with ADL's Yes    Intervention Provide education, individualized exercise plan and daily activity instruction to help decrease symptoms of SOB with activities of daily living.    Expected Outcomes Short Term: Improve cardiorespiratory fitness to achieve a reduction of symptoms when performing ADLs;Long Term: Be able to perform more ADLs without  symptoms or delay the onset of symptoms    Increase knowledge of respiratory medications and ability to use respiratory devices properly  Yes    Intervention Provide education and demonstration as needed of appropriate use of medications, inhalers, and oxygen therapy.    Expected Outcomes Short Term: Achieves understanding of medications use. Understands that oxygen is a medication prescribed by physician. Demonstrates appropriate use of inhaler and oxygen therapy.;Long Term: Maintain appropriate use of medications, inhalers, and oxygen therapy.              Personal Goals Discharge:  Goals and Risk Factor Review     Row Name 01/21/23 1048 01/22/23 1540 02/17/23 1507 02/20/23 1500 03/17/23 0902     Core Components/Risk Factors/Patient Goals Review   Personal Goals Review Weight Management/Obesity;Tobacco Cessation;Improve shortness of breath with ADL's;Develop more efficient breathing techniques such as purse lipped breathing and diaphragmatic breathing and practicing self-pacing with activity. Tobacco Cessation Tobacco Cessation Weight Management/Obesity;Tobacco Cessation;Improve shortness of breath with ADL's;Develop more efficient breathing  techniques such as purse lipped breathing and diaphragmatic breathing and practicing self-pacing with activity. Tobacco Cessation   Review Unable to assess goals. Jaydien has completed 2 exercise sessions. Discussed with pt readiness for smoking cessation. He sts that he thinks about quitting daily but he has never actually made any actions steps to quit. Gave pt resources to view and consider the effects of smoking. Encouraged pt to consider action in quitting. He agreed. Will f/u. Spoke with pt again regarding his readiness to quit smoking. He has not looked at the resources yet and is still contemplative. Discussed with pt the risks of smoking regarding his health. He would like to be able to hike more and discussed smoking's impact on exertional ability.  Highly encouraged pt to set a concrete quit date. He is not interested in Chantix but possibly nicotine patches. Pt discussed with a fellow pt as well. Goal not met for weight loss. Anirvin states he would like to lose weight but is up ~4# since starting. He has spoken to our dietician but states he has a good understanding of what he needs to do. Goal progressing for decreasing his shortness of breath with ADLs. Carry's oxygen has maintained >88% on room air with exertion. He has increased both his workload and METs while maintaining his oxygen saturations. Goal progressing on developing more efficient breathing techniques such as purse lipped breathing and diaphragmatic breathing; and practicing self-pacing with activity. Tyrion needs to be reminded to initiate PLB without staff interference while exercising. He knows how to listen to his body and pace himself when he becomes short of breath. Goal not met for tabacco cessation. Messiyah is not ready to quit smoking at this time. Resources have been provided and staff have assisted in setting up an action plan and quit date. We will continue to monitor and follow along with [facility]. He will continue to benefit from participation in PR for nutrition, education, exercise, and lifestyle modification. Intermittent discussions have not led to pt setting a quit date.   Expected Outcomes For Muad to lose weight, quit smoking, improve his shortness of breath with ADLs, and develop more efficient breathing techniques and learn how to self pace For pt to choose an action plan and set a quit date. For pt to choose an action plan and set a quit date. For Toy to lose weight, quit smoking, improve his shortness of breath with ADLs, and develop more efficient breathing techniques and learn how to self pace To set a quit date for smoking cessation.    Row Name 03/20/23 1443 04/02/23 1513 04/06/23 1437         Core Components/Risk Factors/Patient Goals Review   Personal Goals Review  Weight Management/Obesity;Tobacco Cessation;Improve shortness of breath with ADL's;Develop more efficient breathing techniques such as purse lipped breathing and diaphragmatic breathing and practicing self-pacing with activity. Tobacco Cessation Weight Management/Obesity;Tobacco Cessation;Improve shortness of breath with ADL's     Review Goal not met for weight loss. Shields states he would like to lose weight but is up ~4# since starting. He has spoken to our dietician but states he has a good understanding of what he needs to do. He has not committed to making changes. Goal progressing for decreasing his shortness of breath with ADLs. Carry's oxygen has maintained >88% on room air with exertion. He has increased both his workload and METs while maintaining his oxygen saturations. Goal met on developing more efficient breathing techniques such as purse lipped breathing and diaphragmatic breathing; and  practicing self-pacing with activity. Unique can initiate PLB without staff interference while exercising. He knows how to listen to his body and pace himself when he becomes short of breath. Goal not met for tobacco cessation. Mervyn is not ready to quit smoking at this time. Resources have been provided and staff have assisted in setting up an action plan and quit date. We will continue to monitor and follow along with [facility] on his goals. He will continue to benefit from participation in PR for nutrition, education, exercise, and lifestyle modification. Pt has recently made the decision to only allow himself 10 cigarettes a day and has been successful. Congratulated pt. He prefers this method of decreasing to cessation and we discussed a progressive plan to achieve an ultimate quit date. He agreed, preferring to decrease slowly. He voiced how much he is conditioned to smoking in the car. We discussed strategies to decrease his cravings in the car. Pt agrees. He has previously been given the quitting resources. Tilford  graduated the [employer] on 04/02/23 completing 19 sessions. Core components/risk factors/patient goals graduation re-evaluate are as follows: Goal not met for weight loss. Sheddrick states he would like to lose weight but is up ~4# since starting. He has spoken to our dietician but states he has a good understanding of what he needs to do. He has not committed to making changes. Goal met for decreasing his shortness of breath with ADLs. Carry's oxygen level has maintained >88% on room air with exertion. He has increased both his workload and METs while maintaining his oxygen saturations. Goal not met for tobacco cessation. Foye is not ready to quit smoking at this time. He has cut back but not fully quit. Resources have been provided and staff have assisted in setting up an action plan and quit date.     Expected Outcomes For Delmer to lose weight, quit smoking, and improve his shortness of breath with ADLs To set a quit date. For Sye to lose weight and quit smoking post pulomary rehab.              Exercise Goals and Review:  Exercise Goals     Row Name 01/05/23 1117             Exercise Goals   Increase Physical Activity Yes       Intervention Provide advice, education, support and counseling about physical activity/exercise needs.;Develop an individualized exercise prescription for aerobic and resistive training based on initial evaluation findings, risk stratification, comorbidities and participant's personal goals.       Expected Outcomes Short Term: Attend rehab on a regular basis to increase amount of physical activity.;Long Term: Exercising regularly at least 3-5 days a week.;Long Term: Add in home exercise to make exercise part of routine and to increase amount of physical activity.       Increase Strength and Stamina Yes       Intervention Provide advice, education, support and counseling about physical activity/exercise needs.;Develop an individualized exercise prescription for  aerobic and resistive training based on initial evaluation findings, risk stratification, comorbidities and participant's personal goals.       Expected Outcomes Short Term: Increase workloads from initial exercise prescription for resistance, speed, and METs.;Short Term: Perform resistance training exercises routinely during rehab and add in resistance training at home;Long Term: Improve cardiorespiratory fitness, muscular endurance and strength as measured by increased METs and functional capacity ( )       Able to understand and use rate of  perceived exertion (RPE) scale Yes       Intervention Provide education and explanation on how to use RPE scale       Expected Outcomes Short Term: Able to use RPE daily in rehab to express subjective intensity level;Long Term:  Able to use RPE to guide intensity level when exercising independently       Able to understand and use Dyspnea scale Yes       Intervention Provide education and explanation on how to use Dyspnea scale       Expected Outcomes Short Term: Able to use Dyspnea scale daily in rehab to express subjective sense of shortness of breath during exertion;Long Term: Able to use Dyspnea scale to guide intensity level when exercising independently       Knowledge and understanding of Target Heart Rate Range (THRR) Yes       Intervention Provide education and explanation of THRR including how the numbers were predicted and where they are located for reference       Expected Outcomes Short Term: Able to state/look up THRR;Long Term: Able to use THRR to govern intensity when exercising independently;Short Term: Able to use daily as guideline for intensity in rehab       Understanding of Exercise Prescription Yes       Intervention Provide education, explanation, and written materials on patient's individual exercise prescription       Expected Outcomes Short Term: Able to explain program exercise prescription;Long Term: Able to explain home exercise  prescription to exercise independently                Exercise Goals Re-Evaluation:  Exercise Goals Re-Evaluation     Row Name 01/20/23 0849 02/20/23 1130 03/17/23 0858 04/02/23 1508       Exercise Goal Re-Evaluation   Exercise Goals Review Increase Physical Activity;Able to understand and use Dyspnea scale;Understanding of Exercise Prescription;Increase Strength and Stamina;Knowledge and understanding of Target Heart Rate Range (THRR);Able to understand and use rate of perceived exertion (RPE) scale Increase Physical Activity;Able to understand and use Dyspnea scale;Understanding of Exercise Prescription;Increase Strength and Stamina;Knowledge and understanding of Target Heart Rate Range (THRR);Able to understand and use rate of perceived exertion (RPE) scale Increase Physical Activity;Able to understand and use Dyspnea scale;Understanding of Exercise Prescription;Increase Strength and Stamina;Knowledge and understanding of Target Heart Rate Range (THRR);Able to understand and use rate of perceived exertion (RPE) scale Increase Physical Activity;Able to understand and use Dyspnea scale;Understanding of Exercise Prescription;Increase Strength and Stamina;Knowledge and understanding of Target Heart Rate Range (THRR);Able to understand and use rate of perceived exertion (RPE) scale    Comments Pt has completed two days of group exercise. He will miss today for a vacation. He is exercising on the upright elliptical for 15 min, level 1, incline 1, METs 4.0. He then is walking on the treadmill for 15 min, speed 2.0, incline 0, METs 2.53. Plan to increase his workload next session as he is tolerating exercise well. He performs warm up and cool down without limitations. Pt has completed 9 days of group exercise, missing 3 sessions for vacation and illness. He is exercising on the upright elliptical for 15 min, level 3, incline 3, METs 4.4. He then is walking on the treadmill for 15 min, speed 2.5, incline  2, METs 3.4. He is progressing and motivated. He has goals to be able to hike well. He is using black bands. Pt has completed 14 days of group exercise, missing 4 sessions for vacation and  illness. He is exercising on the upright elliptical for 15 min, level 3, incline 3, METs 4.3. He then is walking on the treadmill for 15 min, speed 2.5-3 mph, incline 2, METs 3.8. He is progressing and motivated. He has goals to be able to hike well. He is using black bands. Pt completed 19 exercise sessions. He progressed his METs to 4.2 on the upright elliptical and 3.6 on the treadmill. Pt increased his 6 min walk test by 12.16%, 168 ft. His MMRC decreased to 1 from 2. His SOB and CAT scores also decreased. He is walking with his wife and plans to start the elliptical and treadmill at the gym, ultimately building to 60 min. He finished the program on black bands. He feels confident to exercise on his own. He graduated today.    Expected Outcomes Through exercise at rehab and home, the patient will decrease shortness of breath with daily activities and feel confident in carrying out an exercise regimen at home Through exercise at rehab and home, the patient will decrease shortness of breath with daily activities and feel confident in carrying out an exercise regimen at home Through exercise at rehab and home, the patient will decrease shortness of breath with daily activities and feel confident in carrying out an exercise regimen at home Through exercise at rehab and home, the patient will decrease shortness of breath with daily activities and feel confident in carrying out an exercise regimen at home             Nutrition & Weight - Outcomes:  Pre Biometrics - 01/05/23 1258       Pre Biometrics   Grip Strength 40 kg              Nutrition:  Nutrition Therapy & Goals - 03/10/23 1443       Nutrition Therapy   Diet General Healthy Diet    Drug/Food Interactions Statins/Certain Fruits      Personal  Nutrition Goals   Nutrition Goal Patient to improve diet quality by using the plate method as a guide for meal planning to include lean protein/plant protein, fruits, vegetables, whole grains, nonfat dairy as part of a well-balanced diet.   goal in progress.   Comments Damek reports no nutrition concerns/questions at this time. He reports eating a wide variety of foods with increased focus on fiber intake related to recent hemrroid surgery. 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 He reports normal bowel movements with consistent dietary fiber intake. He remains pre-contemplative toward smoking cessation; counseled by staff and resouces given on 9/26. He is up 3.7# since starting with our program. Patient will benefit from participation in pulmonary rehab for nutrition, exercise, and lifestyle modification.      Intervention Plan   Intervention Prescribe, educate and counsel regarding individualized specific dietary modifications aiming towards targeted core components such as weight, hypertension, lipid management, diabetes, heart failure and other comorbidities.;Nutrition handout(s) given to patient.    Expected Outcomes Short Term Goal: Understand basic principles of dietary content, such as calories, fat, sodium, cholesterol and nutrients.;Long Term Goal: Adherence to prescribed nutrition plan.             Nutrition Discharge:  Nutrition Assessments - 03/31/23 1528       Rate Your Plate Scores   Post Score 59             Education Questionnaire Score:  Knowledge Questionnaire Score -  03/31/23 1503       Knowledge Questionnaire Score   Post Score 17/18            Meziah graduated the Pulmonary Rehab program on 04/02/2023 completing 19 sessions. Psychosocial graduation re-evaluation is as follows: Charod still denies any psychosocial needs or barriers. He states he has good support from his wife and  enjoys camping for stress relief. He denied any psy/soc needs at time of discharge.   Core components/risk factors/patient goals graduation re-evaluate are as follows: Goal not met for weight loss. Khi states he would like to lose weight but is up ~4# since starting. He has spoken to our dietician but states he has a good understanding of what he needs to do. He has not committed to making changes. Goal met for decreasing his shortness of breath with ADLs. Carry's oxygen level has maintained >88% on room air with exertion. He has increased both his workload and METs while maintaining his oxygen saturations. Goal not met for tobacco cessation. Akiel is not ready to quit smoking at this time. He has cut back but not fully quit. Resources have been provided and staff have assisted in setting up an action plan and quit date.   Goals reviewed with patient; copy given to patient.

## 2023-04-15 DIAGNOSIS — K080 Exfoliation of teeth due to systemic causes: Secondary | ICD-10-CM | POA: Diagnosis not present

## 2023-05-08 DIAGNOSIS — H25043 Posterior subcapsular polar age-related cataract, bilateral: Secondary | ICD-10-CM | POA: Diagnosis not present

## 2023-05-08 DIAGNOSIS — H2512 Age-related nuclear cataract, left eye: Secondary | ICD-10-CM | POA: Diagnosis not present

## 2023-05-08 DIAGNOSIS — H2513 Age-related nuclear cataract, bilateral: Secondary | ICD-10-CM | POA: Diagnosis not present

## 2023-05-08 DIAGNOSIS — H25013 Cortical age-related cataract, bilateral: Secondary | ICD-10-CM | POA: Diagnosis not present

## 2023-05-08 DIAGNOSIS — H18413 Arcus senilis, bilateral: Secondary | ICD-10-CM | POA: Diagnosis not present

## 2023-06-08 ENCOUNTER — Other Ambulatory Visit (HOSPITAL_BASED_OUTPATIENT_CLINIC_OR_DEPARTMENT_OTHER): Payer: Self-pay

## 2023-06-29 DIAGNOSIS — H2513 Age-related nuclear cataract, bilateral: Secondary | ICD-10-CM | POA: Diagnosis not present

## 2023-06-29 DIAGNOSIS — H2512 Age-related nuclear cataract, left eye: Secondary | ICD-10-CM | POA: Diagnosis not present

## 2023-06-30 DIAGNOSIS — H2511 Age-related nuclear cataract, right eye: Secondary | ICD-10-CM | POA: Diagnosis not present

## 2023-07-20 DIAGNOSIS — H25041 Posterior subcapsular polar age-related cataract, right eye: Secondary | ICD-10-CM | POA: Diagnosis not present

## 2023-07-20 DIAGNOSIS — H25011 Cortical age-related cataract, right eye: Secondary | ICD-10-CM | POA: Diagnosis not present

## 2023-07-20 DIAGNOSIS — H2511 Age-related nuclear cataract, right eye: Secondary | ICD-10-CM | POA: Diagnosis not present

## 2023-07-22 ENCOUNTER — Other Ambulatory Visit: Payer: Self-pay | Admitting: Cardiovascular Disease

## 2023-07-27 ENCOUNTER — Other Ambulatory Visit (HOSPITAL_BASED_OUTPATIENT_CLINIC_OR_DEPARTMENT_OTHER): Payer: Self-pay

## 2023-08-05 DIAGNOSIS — K080 Exfoliation of teeth due to systemic causes: Secondary | ICD-10-CM | POA: Diagnosis not present

## 2023-08-17 ENCOUNTER — Encounter (HOSPITAL_BASED_OUTPATIENT_CLINIC_OR_DEPARTMENT_OTHER): Payer: Self-pay | Admitting: Pulmonary Disease

## 2023-08-17 ENCOUNTER — Ambulatory Visit (HOSPITAL_BASED_OUTPATIENT_CLINIC_OR_DEPARTMENT_OTHER): Payer: Medicare Other | Admitting: Pulmonary Disease

## 2023-08-17 ENCOUNTER — Other Ambulatory Visit (HOSPITAL_BASED_OUTPATIENT_CLINIC_OR_DEPARTMENT_OTHER): Payer: Self-pay

## 2023-08-17 VITALS — BP 126/78 | HR 65 | Ht 71.0 in | Wt 195.2 lb

## 2023-08-17 DIAGNOSIS — J441 Chronic obstructive pulmonary disease with (acute) exacerbation: Secondary | ICD-10-CM

## 2023-08-17 DIAGNOSIS — J449 Chronic obstructive pulmonary disease, unspecified: Secondary | ICD-10-CM

## 2023-08-17 MED ORDER — BREZTRI AEROSPHERE 160-9-4.8 MCG/ACT IN AERO
2.0000 | INHALATION_SPRAY | Freq: Two times a day (BID) | RESPIRATORY_TRACT | 11 refills | Status: DC
  Filled 2023-08-17 – 2023-08-19 (×2): qty 10.7, 30d supply, fill #0
  Filled 2023-10-05: qty 10.7, 30d supply, fill #1
  Filled 2023-11-07: qty 10.7, 30d supply, fill #2
  Filled 2023-12-04: qty 10.7, 30d supply, fill #3
  Filled 2024-01-11: qty 10.7, 30d supply, fill #4
  Filled 2024-02-10: qty 10.7, 30d supply, fill #5
  Filled 2024-03-14: qty 10.7, 30d supply, fill #6

## 2023-08-17 NOTE — Addendum Note (Signed)
 Addended by: Quillian Brunt on: 08/17/2023 10:09 AM   Modules accepted: Orders

## 2023-08-17 NOTE — Patient Instructions (Signed)
 Severe COPD Deconditioning --CONTINUE Breztri  TWO puffs in the morning and evening. Rinse mouth out after use --CONTINUE Albuterol  AS NEEDED for shortness of breath or wheezing. OK to use prior to exercise --CONTINUE pulmonary rehab and start [facility] exercise once you graduate --Encouraged regular aerobic exercise five days a week for 30 min  Tobacco abuse Patient is an active smoker. We discussed smoking cessation for 3 minutes. We discussed triggers and stressors and ways to deal with them. We discussed barriers to continued smoking and benefits of smoking cessation. Provided patient with information cessation techniques and interventions including [place] quitline.

## 2023-08-17 NOTE — Progress Notes (Signed)
 Subjective:   PATIENT ID: Brad Massey GENDER: male DOB: May 27, 1955, MRN: [id number]   HPI  Chief Complaint  Patient presents with   Follow-up    COPD    Reason for Visit: Follow-up COPD  [name] is a 68 year old active smoker with CAD, HLD and HTN who presents for follow-up.  Synopsis: He was recently see on 04/06/20 by Cardiology with Dr. Rolm Clos for shortness of breath. He had PFTs ordered which demonstrated severe COPD. From a cardiac standpoint he has completed an exercise stress test that was negative. Remains an active smoker. He reports history of gradually worsening shortness of breath in the last year. He notices it with walking uphill and heavy exertion. He has productive cough daily that is new and usually after meals. Sputum is thick and clear. Rarely wheezing. Last week he was able to walk several miles on the beach. Shortness of breath worsens with the cold due to inactivity. He is planning on joining a gym today. 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  Since our last visit, he reports intermittent use of Anoro 2-3 days a week but working on more daily use. He has occasional chronic cough with sputum production. He continues to smoke. He is planning on quitting and has patches and gum at the house. His wife is a non smoker and is supportive. Has shortness of breath with moderate and heavy exertion including when walking uphill. He remains active and recently went on a 6 mile walking trail in [place] where he only required a few stops but able to complete the trail in a few hours. He is also planning for dental work this week and questions risk of thrush with inhalers.  12/24/22 Our last visit on was 09/18/20 and was on Anoro for his COPD. Not on maintenance therapy with use of albuterol  intermittently. He had rectal surgery in May 2024 for internal and external hemorrhoids. Has had difficulty with pain post-op. Also developed  cluster headaches and treated with meds, steroids shots. During this time he has had gradually worsening shortness of breath and swelling of legs. Seen by PCP two weeks ago and treated with IM steroid and Augmentin . Never got diuretics. Lower extremity swelling self resolved. He reports he is still smoking 1 ppd. Previously walking 5 miles ever other daily. Has not exercised recently. At baseline now he has occasional cough that may be sinus drainage related.   03/24/23 Has been participating in pulmonary rehab and working well. Near graduation. Will likely plan to join [facility] with his wife afterwards. He does have have some mucous production that is minimal at night. Did have a mild respiratory illness last month requiring to sleep sitting up but doing well now. Pulmonary rehab has increased his stamina. He still has shortness of breath with carrying heavy loads. Denies wheezing. He is smoking 3/4 pack a day and working to quitting. Has not been taking the Breztri  consistently  08/17/23 Since our last visit he was started on Breztri  and feels it is helpful and makes it easier to breath. Less chest tightness and less mucous. Uses albuterol  1-2 times after exertion every few weeks. Previously started the treadmill but hurt his back over the holiday and also had cataract surgery so has not gotten back into a regular exercise routine. Hoping to restart exercise with better weather. Has shortness of breath with activity. Continues to smoke 3/4 pack per day.  Social History: Current  smoker Enjoys Youth worker   Past Medical History:  Diagnosis Date   Cluster headache    COPD (chronic obstructive pulmonary disease) (HCC)    Coronary artery disease    DVT (deep venous thrombosis) (HCC)    Heart murmur    Hypertension      Family History  Problem Relation Age of Onset   Heart disease Mother    Arrhythmia Mother    Heart disease Father    Colon cancer Neg Hx    Colitis Neg Hx    Esophageal cancer  Neg Hx    Rectal cancer Neg Hx    Stomach cancer Neg Hx      Social History   Occupational History   Not on file  Tobacco Use   Smoking status: Every Day    Current packs/day: 1.00    Average packs/day: 1 pack/day for 47.1 years (47.1 ttl pk-yrs)    Types: Cigarettes    Start date: 06/25/1976   Smokeless tobacco: Never   Tobacco comments:    smokes 1 ppd  Substance and Sexual Activity   Alcohol  use: Not Currently   Drug use: Never   Sexual activity: Yes    Partners: Female    Allergies  Allergen Reactions   Clindamycin/Lincomycin Rash     Outpatient Medications Prior to Visit  Medication Sig Dispense Refill   albuterol  (VENTOLIN  HFA) 108 (90 Base) MCG/ACT inhaler Inhale into the lungs every 6 (six) hours as needed for wheezing or shortness of breath.     aspirin  EC 81 MG tablet Take 1 tablet (81 mg total) by mouth daily. Swallow whole. 90 tablet 3   budeson-glycopyrrolate -formoterol  (BREZTRI  AEROSPHERE) 160-9-4.8 MCG/ACT AERO Inhale 2 puffs into the lungs in the morning and at bedtime. 10.7 g 5   metoprolol  succinate (TOPROL -XL) 100 MG 24 hr tablet Take 1 tablet (100 mg total) by mouth daily. 90 tablet 2   rosuvastatin  (CRESTOR ) 40 MG tablet Take 1 tablet (40 mg total) by mouth daily. 30 tablet 10   nitroGLYCERIN  (NITROSTAT ) 0.4 MG SL tablet Place 1 tablet (0.4 mg total) under the tongue every 5 (five) minutes as needed for chest pain. 25 tablet 3   Facility-Administered Medications Prior to Visit  Medication Dose Route Frequency Provider Last Rate Last Admin   0.9 %  sodium chloride  infusion  500 mL Intravenous Continuous Armbruster, Lendon Queen, MD        Review of Systems  Constitutional:  Negative for chills, diaphoresis, fever, malaise/fatigue and weight loss.  HENT:  Negative for congestion.   Respiratory:  Positive for shortness of breath. Negative for cough, hemoptysis, sputum production and wheezing.   Cardiovascular:  Negative for chest pain (chest tightness),  palpitations and leg swelling.     Objective:   Vitals:   08/17/23 0932  BP: 126/78  Pulse: 65  SpO2: 94%  Weight: 195 lb 3.2 oz (88.5 kg)  Height: 5\' 11"  (1.803 m)     SpO2: 94 %  Physical Exam: General: Well-appearing, no acute distress HENT: [place], AT Eyes: EOMI, no scleral icterus Respiratory: Clear to auscultation bilaterally.  No crackles, wheezing or rales Cardiovascular: RRR, -M/R/G, no JVD Extremities:-Edema,-tenderness Neuro: AAO x4, CNII-XII grossly intact Psych: Normal mood, normal affect  Data Reviewed:  Imaging: CT Cardiac scoring 01/31/20 - Lung fields with no infiltrate, effusion or edema. Lungs with no pulmonary nodules or masses CTA Chest Aorta 03/18/23 - Scarring with mild volume loss in lingula with nodular configuration in 9mm, centrilobular emphysema  PFT:  05/24/20 FVC 3.65 (72%) FEV1 1.86 (49%) Ratio 45  TLC 106% RV 142% RV/TLC 130% DLCO 69% Interpretation: Severe obstructive defect with air trapping and reduced DLCO consistent with emphysema. Significant bronchodilator response present.  Labs: CBC No results found for: "WBC", "RBC", "HGB", "HCT", "PLT", "MCV", "MCH", "MCHC", "RDW", "LYMPHSABS", "MONOABS", "EOSABS", "BASOSABS"  Assessment & Plan:   Discussion: 68 year old male active smoker who presents for COPD follow-up. Mild symptoms but not as active as previously before. Discussed clinical course and management of COPD including bronchodilator regimen, preventive care and action plan for exacerbation.  Severe COPD Deconditioning --CONTINUE Breztri  TWO puffs in the morning and evening. Rinse mouth out after use --CONTINUE Albuterol  AS NEEDED for shortness of breath or wheezing. OK to use prior to exercise --CONTINUE pulmonary rehab and start [facility] exercise once you graduate --Encouraged regular aerobic exercise five days a week for 30 min --Monitor O2 sats in the future if dyspnea worsens  Tobacco abuse Patient is an active smoker. We  discussed smoking cessation for 3 minutes. We discussed triggers and stressors and ways to deal with them. We discussed barriers to continued smoking and benefits of smoking cessation. Provided patient with information cessation techniques and interventions including [place] quitline.  Health Maintenance Immunization History  Administered Date(s) Administered   Influenza,inj,Quad PF,6+ Mos 12/28/2019   PFIZER(Purple Top)SARS-COV-2 Vaccination 07/15/2019, 08/04/2019, 04/23/2020   Pfizer Covid-19 Vaccine Bivalent Booster 28yrs & up 02/12/2021   Pfizer(Comirnaty )Fall Seasonal Vaccine 12 years and older 04/17/2022   CT Lung Screen - We discussed risks and benefits of screen. Patient declined for now and will like to revisit at next   Orders Placed This Encounter  Procedures   Pulmonary function test    Standing Status:   Future    Expiration Date:   08/16/2024    Where should this test be performed?:   Outpatient Pulmonary    What type of PFT is being ordered?:   Full PFT   No orders of the defined types were placed in this encounter.  Return in about 6 months (around 02/16/2024) for after PFT.  I have spent a total time of 30-minutes on the day of the appointment including chart review, data review, collecting history, coordinating care and discussing medical diagnosis and plan with the patient/family. Past medical history, allergies, medications were reviewed. Pertinent imaging, labs and tests included in this note have been reviewed and interpreted independently by me.  Brad Massey Genetta Kenning, MD [facility] 08/17/2023 10:05 AM

## 2023-08-19 ENCOUNTER — Other Ambulatory Visit: Payer: Self-pay

## 2023-08-19 ENCOUNTER — Other Ambulatory Visit (HOSPITAL_BASED_OUTPATIENT_CLINIC_OR_DEPARTMENT_OTHER): Payer: Self-pay

## 2023-10-05 ENCOUNTER — Other Ambulatory Visit (HOSPITAL_BASED_OUTPATIENT_CLINIC_OR_DEPARTMENT_OTHER): Payer: Self-pay

## 2023-10-21 DIAGNOSIS — K080 Exfoliation of teeth due to systemic causes: Secondary | ICD-10-CM | POA: Diagnosis not present

## 2023-11-07 ENCOUNTER — Other Ambulatory Visit (HOSPITAL_BASED_OUTPATIENT_CLINIC_OR_DEPARTMENT_OTHER): Payer: Self-pay

## 2023-11-20 ENCOUNTER — Other Ambulatory Visit: Payer: Self-pay

## 2023-11-20 MED ORDER — NITROGLYCERIN 0.4 MG SL SUBL: 0.4000 mg | SUBLINGUAL_TABLET | SUBLINGUAL | 0 refills | Status: AC | PRN

## 2023-12-04 ENCOUNTER — Other Ambulatory Visit (HOSPITAL_BASED_OUTPATIENT_CLINIC_OR_DEPARTMENT_OTHER): Payer: Self-pay

## 2023-12-23 DIAGNOSIS — R82998 Other abnormal findings in urine: Secondary | ICD-10-CM | POA: Diagnosis not present

## 2023-12-23 DIAGNOSIS — Z125 Encounter for screening for malignant neoplasm of prostate: Secondary | ICD-10-CM | POA: Diagnosis not present

## 2023-12-23 DIAGNOSIS — I10 Essential (primary) hypertension: Secondary | ICD-10-CM | POA: Diagnosis not present

## 2024-01-05 DIAGNOSIS — Z23 Encounter for immunization: Secondary | ICD-10-CM | POA: Diagnosis not present

## 2024-01-05 DIAGNOSIS — I10 Essential (primary) hypertension: Secondary | ICD-10-CM | POA: Diagnosis not present

## 2024-01-05 DIAGNOSIS — Z0000 Encounter for general adult medical examination without abnormal findings: Secondary | ICD-10-CM | POA: Diagnosis not present

## 2024-01-05 DIAGNOSIS — R972 Elevated prostate specific antigen [PSA]: Secondary | ICD-10-CM | POA: Diagnosis not present

## 2024-01-05 DIAGNOSIS — J449 Chronic obstructive pulmonary disease, unspecified: Secondary | ICD-10-CM | POA: Diagnosis not present

## 2024-01-11 ENCOUNTER — Other Ambulatory Visit (HOSPITAL_BASED_OUTPATIENT_CLINIC_OR_DEPARTMENT_OTHER): Payer: Self-pay

## 2024-01-21 ENCOUNTER — Other Ambulatory Visit (HOSPITAL_BASED_OUTPATIENT_CLINIC_OR_DEPARTMENT_OTHER): Payer: Self-pay | Admitting: Pulmonary Disease

## 2024-01-21 DIAGNOSIS — R0600 Dyspnea, unspecified: Secondary | ICD-10-CM

## 2024-01-22 ENCOUNTER — Other Ambulatory Visit (HOSPITAL_BASED_OUTPATIENT_CLINIC_OR_DEPARTMENT_OTHER): Payer: Self-pay | Admitting: Pulmonary Disease

## 2024-01-22 DIAGNOSIS — R0600 Dyspnea, unspecified: Secondary | ICD-10-CM

## 2024-02-10 ENCOUNTER — Other Ambulatory Visit (HOSPITAL_BASED_OUTPATIENT_CLINIC_OR_DEPARTMENT_OTHER): Payer: Self-pay

## 2024-02-16 DIAGNOSIS — K080 Exfoliation of teeth due to systemic causes: Secondary | ICD-10-CM | POA: Diagnosis not present

## 2024-02-25 ENCOUNTER — Other Ambulatory Visit: Payer: Self-pay | Admitting: Nurse Practitioner

## 2024-03-14 ENCOUNTER — Other Ambulatory Visit (HOSPITAL_BASED_OUTPATIENT_CLINIC_OR_DEPARTMENT_OTHER): Payer: Self-pay

## 2024-03-22 ENCOUNTER — Other Ambulatory Visit: Payer: Self-pay | Admitting: Cardiovascular Disease

## 2024-03-30 DIAGNOSIS — D3131 Benign neoplasm of right choroid: Secondary | ICD-10-CM | POA: Diagnosis not present

## 2024-03-31 NOTE — Progress Notes (Unsigned)
 Cardiology Office Note:  .   Date:  04/01/2024  ID:  Brad Massey, DOB 04-16-1956, MRN [id number] PCP: Brad Charlie ORN, MD  [facility] Providers Cardiologist:  Brad ONEIDA Decent, MD Cardiology APP:  Madie Jon Garre, Massey   History of Present Illness: .    Chief Complaint  Patient presents with   Follow-up         Brad Massey is a 68 y.o. male with history of CAD, HLD, emphysema who presents for follow-up.   History of Present Illness   Brad Massey is a 68 year old male with COPD and elevated coronary calcium  score who presents with worsening shortness of breath.  He has experienced worsening shortness of breath over the past few weeks, particularly towards the end of the day. Albuterol  provides relief most of the time, but not always. He describes an incident where he became short of breath after walking 400-500 yards in the park, necessitating a rest on a bench. No chest discomfort is associated with these episodes.  He has a history of COPD and continues to smoke, although he is attempting to cut down due to health concerns. He is scheduled to see his pulmonologist soon and will undergo a breathing test prior to the appointment.  He notes some swelling in his legs, which is more pronounced at the end of the day and improves by morning. He has a history of an elevated coronary calcium  score of 1082 in the 95th percentile, but his recent cholesterol levels are reported as good, with an LDL of 41. He continues to take Crestor  40 mg daily and a baby aspirin .  His blood pressure was higher than usual during the visit, although he does not regularly monitor it at home.          Problem List 1. CAD -coronary calcium  score 1082 (95th percentile) -ETT negative 2. HLD -T chol 93, HDL 39, LDL 41, TG 67 3. Tobacco abuse/COPD -severe COPD -40 pack year history    ROS: All other ROS reviewed and negative. Pertinent positives noted in the HPI.     Studies Reviewed: [facility]   EKG  Interpretation Date/Time:  Friday April 01 2024 14:01:17 EST Ventricular Rate:  79 PR Interval:  196 QRS Duration:  88 QT Interval:  382 QTC Calculation: 438 R Axis:   -57  Text Interpretation: Normal sinus rhythm Left axis deviation Confirmed by Massey Brad [phone]) on 04/01/2024 2:02:28 PM   Physical Exam:   VS:  BP (!) 144/100   Pulse 79   Ht 5' 11 (1.803 m)   Wt 197 lb (89.4 kg)   SpO2 95%   BMI 27.48 kg/m    Wt Readings from Last 3 Encounters:  04/01/24 197 lb (89.4 kg)  08/17/23 195 lb 3.2 oz (88.5 kg)  03/24/23 188 lb 4.4 oz (85.4 kg)    GEN: Well nourished, well developed in no acute distress NECK: No JVD; No carotid bruits CARDIAC: RRR, no murmurs, rubs, gallops RESPIRATORY:  Clear to auscultation without rales, wheezing or rhonchi  ABDOMEN: Soft, non-tender, non-distended EXTREMITIES:  No edema; No deformity  ASSESSMENT AND PLAN: .   Assessment and Plan    Chronic obstructive pulmonary disease Severe COPD with worsening dyspnea. Differential includes pulmonary hypertension due to COPD. - Ordered echocardiogram to assess for pulmonary hypertension. - Referred to pulmonologist for further evaluation and management. - Encouraged smoking cessation.  Atherosclerotic heart disease of native coronary artery without angina Elevated coronary calcium  score of 1082  with negative stress testing. No current angina. - Continue aspirin  therapy. - Continue Crestor  40 mg daily.  Hyperlipidemia Cholesterol levels well-controlled. Recent LDL cholesterol is 41. - Continue Crestor  40 mg daily.  Tobacco use disorder Continues to smoke but has reduced smoking. Acknowledged need to quit due to worsening respiratory symptoms. - Encouraged smoking cessation.              Follow-up: Return in about 1 year (around 04/01/2025).  Signed, Brad DASEN. Barbaraann, MD, [facility]  [facility]  [address] [place], [place] [address] [phone]  2:18 PM

## 2024-04-01 ENCOUNTER — Encounter: Payer: Self-pay | Admitting: Cardiovascular Disease

## 2024-04-01 ENCOUNTER — Ambulatory Visit: Attending: Cardiovascular Disease | Admitting: Cardiovascular Disease

## 2024-04-01 VITALS — BP 144/100 | HR 79 | Ht 71.0 in | Wt 197.0 lb

## 2024-04-01 DIAGNOSIS — E785 Hyperlipidemia, unspecified: Secondary | ICD-10-CM | POA: Diagnosis not present

## 2024-04-01 DIAGNOSIS — I2510 Atherosclerotic heart disease of native coronary artery without angina pectoris: Secondary | ICD-10-CM

## 2024-04-01 DIAGNOSIS — R0602 Shortness of breath: Secondary | ICD-10-CM

## 2024-04-01 DIAGNOSIS — J449 Chronic obstructive pulmonary disease, unspecified: Secondary | ICD-10-CM

## 2024-04-01 DIAGNOSIS — I10 Essential (primary) hypertension: Secondary | ICD-10-CM

## 2024-04-01 DIAGNOSIS — Z720 Tobacco use: Secondary | ICD-10-CM | POA: Diagnosis not present

## 2024-04-01 NOTE — Patient Instructions (Signed)
 Medication Instructions:  Your physician recommends that you continue on your current medications as directed. Please refer to the Current Medication list given to you today.  *If you need a refill on your cardiac medications before your next appointment, please call your pharmacy*  Lab Work: NONE ORDERED   Testing/Procedures: ECHO First available Your physician has requested that you have an echocardiogram. Echocardiography is a painless test that uses sound waves to create images of your heart. It provides your doctor with information about the size and shape of your heart and how well your heart's chambers and valves are working. This procedure takes approximately one hour. There are no restrictions for this procedure. Please do NOT wear cologne, perfume, aftershave, or lotions (deodorant is allowed). Please arrive 15 minutes prior to your appointment time.  Please note: We ask at that you not bring children with you during ultrasound (echo/ vascular) testing. Due to room size and safety concerns, children are not allowed in the ultrasound rooms during exams. Our front office staff cannot provide observation of children in our lobby area while testing is being conducted. An adult accompanying a patient to their appointment will only be allowed in the ultrasound room at the discretion of the ultrasound technician under special circumstances. We apologize for any inconvenience.   Follow-Up: At [facility], you and your health needs are our priority.  As part of our continuing mission to provide you with exceptional heart care, our providers are all part of one team.  This team includes your primary Cardiologist (physician) and Advanced Practice Providers or APPs (Physician Assistants and Nurse Practitioners) who all work together to provide you with the care you need, when you need it.  Your next appointment:   1 year(s)  Provider:   Darryle ONEIDA Decent, MD    We recommend signing up  for the patient portal called MyChart.  Sign up information is provided on this After Visit Summary.  MyChart is used to connect with patients for Virtual Visits (Telemedicine).  Patients are able to view lab/test results, encounter notes, upcoming appointments, etc.  Non-urgent messages can be sent to your provider as well.   To learn more about what you can do with MyChart, go to [email or web address].

## 2024-04-05 ENCOUNTER — Ambulatory Visit (HOSPITAL_BASED_OUTPATIENT_CLINIC_OR_DEPARTMENT_OTHER)

## 2024-04-05 ENCOUNTER — Ambulatory Visit (HOSPITAL_BASED_OUTPATIENT_CLINIC_OR_DEPARTMENT_OTHER): Admitting: Pulmonary Disease

## 2024-04-05 ENCOUNTER — Other Ambulatory Visit (HOSPITAL_BASED_OUTPATIENT_CLINIC_OR_DEPARTMENT_OTHER): Payer: Self-pay

## 2024-04-05 ENCOUNTER — Encounter (HOSPITAL_BASED_OUTPATIENT_CLINIC_OR_DEPARTMENT_OTHER): Payer: Self-pay | Admitting: Pulmonary Disease

## 2024-04-05 VITALS — BP 132/80 | HR 70 | Ht 71.0 in | Wt 198.8 lb

## 2024-04-05 DIAGNOSIS — J449 Chronic obstructive pulmonary disease, unspecified: Secondary | ICD-10-CM | POA: Diagnosis not present

## 2024-04-05 LAB — PULMONARY FUNCTION TEST
DL/VA % pred: 74 %
DL/VA: 3.05 ml/min/mmHg/L
DLCO unc % pred: 51 %
DLCO unc: 14.68 ml/min/mmHg
FEF 25-75 Post: 0.83 L/s
FEF 25-75 Pre: 0.43 L/s
FEF2575-%Change-Post: 94 %
FEF2575-%Pred-Post: 29 %
FEF2575-%Pred-Pre: 14 %
FEV1-%Change-Post: 27 %
FEV1-%Pred-Post: 36 %
FEV1-%Pred-Pre: 28 %
FEV1-Post: 1.34 L
FEV1-Pre: 1.05 L
FEV1FVC-%Change-Post: 2 %
FEV1FVC-%Pred-Pre: 59 %
FEV6-%Change-Post: 26 %
FEV6-%Pred-Post: 60 %
FEV6-%Pred-Pre: 48 %
FEV6-Post: 2.84 L
FEV6-Pre: 2.24 L
FEV6FVC-%Change-Post: 1 %
FEV6FVC-%Pred-Post: 101 %
FEV6FVC-%Pred-Pre: 99 %
FVC-%Change-Post: 24 %
FVC-%Pred-Post: 60 %
FVC-%Pred-Pre: 48 %
FVC-Post: 2.96 L
FVC-Pre: 2.37 L
Post FEV1/FVC ratio: 45 %
Post FEV6/FVC ratio: 96 %
Pre FEV1/FVC ratio: 44 %
Pre FEV6/FVC Ratio: 95 %
RV % pred: 204 %
RV: 5.05 L
TLC % pred: 109 %
TLC: 8.15 L

## 2024-04-05 MED ORDER — FLUTICASONE-SALMETEROL 230-21 MCG/ACT IN AERO
2.0000 | INHALATION_SPRAY | Freq: Two times a day (BID) | RESPIRATORY_TRACT | 5 refills | Status: AC
  Filled 2024-04-05: qty 12, 30d supply, fill #0
  Filled 2024-05-02 – 2024-05-05 (×2): qty 12, 30d supply, fill #1
  Filled 2024-06-01: qty 12, 30d supply, fill #2

## 2024-04-05 MED ORDER — ALBUTEROL SULFATE HFA 108 (90 BASE) MCG/ACT IN AERS
2.0000 | INHALATION_SPRAY | Freq: Four times a day (QID) | RESPIRATORY_TRACT | 5 refills | Status: AC | PRN
  Filled 2024-04-05: qty 6.7, 25d supply, fill #0

## 2024-04-05 MED ORDER — SPIRIVA RESPIMAT 2.5 MCG/ACT IN AERS
2.0000 | INHALATION_SPRAY | Freq: Every day | RESPIRATORY_TRACT | 5 refills | Status: AC
  Filled 2024-04-05: qty 4, 30d supply, fill #0
  Filled 2024-05-02 – 2024-05-05 (×2): qty 4, 30d supply, fill #1
  Filled 2024-06-01: qty 4, 30d supply, fill #2

## 2024-04-05 NOTE — Progress Notes (Signed)
 Full PFT performed today.

## 2024-04-05 NOTE — Patient Instructions (Signed)
 Very severe COPD Deconditioning --Reviewed pulmonary function test --Will contact pharmacy team regarding inhaler options. Will change to different inhaler with stronger inhaled steroid component --STOP Breztri . Will call you today regarding what changes --CONTINUE Albuterol  AS NEEDED for shortness of breath or wheezing. OK to use prior to exercise --Ambulatory O2 today --Encouraged regular aerobic exercise five days a week for 30 min  Tobacco abuse Patient is an active smoker. We discussed smoking cessation for 5 minutes. We discussed triggers and stressors and ways to deal with them. We discussed barriers to continued smoking and benefits of smoking cessation. Provided patient with information cessation techniques and interventions including [place] quitline.

## 2024-04-05 NOTE — Patient Instructions (Signed)
 Full PFT performed today.

## 2024-04-05 NOTE — Progress Notes (Unsigned)
 Subjective:   PATIENT ID: Brad Massey GENDER: male DOB: [birth year], MRN: [id number]   HPI  Chief Complaint  Patient presents with   COPD    Reason for Visit: Follow-up COPD  [name] is a 68 year old active smoker with CAD, HLD and HTN who presents for follow-up.  Synopsis: He was recently see on 04/06/20 by Cardiology with Dr. Barbaraann for shortness of breath. He had PFTs ordered which demonstrated severe COPD. From a cardiac standpoint he has completed an exercise stress test that was negative. Remains an active smoker. He reports history of gradually worsening shortness of breath in the last year. He notices it with walking uphill and heavy exertion. He has productive cough daily that is new and usually after meals. Sputum is thick and clear. Rarely wheezing. Last week he was able to walk several miles on the beach. Shortness of breath worsens with the cold due to inactivity. He is planning on joining a gym today. 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  Since our last visit, he reports intermittent use of Anoro 2-3 days a week but working on more daily use. He has occasional chronic cough with sputum production. He continues to smoke. He is planning on quitting and has patches and gum at the house. His wife is a non smoker and is supportive. Has shortness of breath with moderate and heavy exertion including when walking uphill. He remains active and recently went on a 6 mile walking trail in [place] where he only required a few stops but able to complete the trail in a few hours. He is also planning for dental work this week and questions risk of thrush with inhalers.  12/24/22 Our last visit on was 09/18/20 and was on Anoro for his COPD. Not on maintenance therapy with use of albuterol  intermittently. He had rectal surgery in May 2024 for internal and external hemorrhoids. Has had difficulty with pain post-op. Also developed cluster headaches  and treated with meds, steroids shots. During this time he has had gradually worsening shortness of breath and swelling of legs. Seen by PCP two weeks ago and treated with IM steroid and Augmentin . Never got diuretics. Lower extremity swelling self resolved. He reports he is still smoking 1 ppd. Previously walking 5 miles ever other daily. Has not exercised recently. At baseline now he has occasional cough that may be sinus drainage related.   03/24/23 Has been participating in pulmonary rehab and working well. Near graduation. Will likely plan to join [employer] with his wife afterwards. He does have have some mucous production that is minimal at night. Did have a mild respiratory illness last month requiring to sleep sitting up but doing well now. Pulmonary rehab has increased his stamina. He still has shortness of breath with carrying heavy loads. Denies wheezing. He is smoking 3/4 pack a day and working to quitting. Has not been taking the Breztri  consistently  08/17/23 Since our last visit he was started on Breztri  and feels it is helpful and makes it easier to breath. Less chest tightness and less mucous. Uses albuterol  1-2 times after exertion every few weeks. Previously started the treadmill but hurt his back over the holiday and also had cataract surgery so has not gotten back into a regular exercise routine. Hoping to restart exercise with better weather. Has shortness of breath with activity. Continues to smoke 3/4 pack per day.  04/05/24 He has reduced smoking to 1/2  ppd and not smoking the entire cigarette. Has not tried nicotine patches. Usually smokes with a cup of coffee. Shortness of breath with activity. No exacerbations since last visit.  Social History: Current smoker Enjoys youth worker   Past Medical History:  Diagnosis Date   Cluster headache    COPD (chronic obstructive pulmonary disease) (HCC)    Coronary artery disease    DVT (deep venous thrombosis) (HCC)    Heart murmur     Hypertension      Family History  Problem Relation Age of Onset   Heart disease Mother    Arrhythmia Mother    Heart disease Father    Colon cancer Neg Hx    Colitis Neg Hx    Esophageal cancer Neg Hx    Rectal cancer Neg Hx    Stomach cancer Neg Hx      Social History   Occupational History   Not on file  Tobacco Use   Smoking status: Every Day    Current packs/day: 1.00    Average packs/day: 1 pack/day for 47.8 years (47.8 ttl pk-yrs)    Types: Cigarettes    Start date: 06/25/1976   Smokeless tobacco: Never   Tobacco comments:    smokes 1 ppd  Substance and Sexual Activity   Alcohol  use: Not Currently   Drug use: Never   Sexual activity: Yes    Partners: Female    Allergies  Allergen Reactions   Clindamycin/Lincomycin Rash     Outpatient Medications Prior to Visit  Medication Sig Dispense Refill   albuterol  (VENTOLIN  HFA) 108 (90 Base) MCG/ACT inhaler Inhale into the lungs every 6 (six) hours as needed for wheezing or shortness of breath.     aspirin  EC 81 MG tablet Take 1 tablet (81 mg total) by mouth daily. Swallow whole. 90 tablet 3   budesonide -glycopyrrolate -formoterol  (BREZTRI  AEROSPHERE) 160-9-4.8 MCG/ACT AERO inhaler Inhale 2 puffs into the lungs in the morning and at bedtime. 10.7 g 11   metoprolol  succinate (TOPROL -XL) 100 MG 24 hr tablet Take 1 tablet (100 mg total) by mouth daily. 90 tablet 2   nitroGLYCERIN  (NITROSTAT ) 0.4 MG SL tablet Place 1 tablet (0.4 mg total) under the tongue every 5 (five) minutes as needed for chest pain. 75 tablet 0   rosuvastatin  (CRESTOR ) 40 MG tablet Take 1 tablet (40 mg total) by mouth daily. 15 tablet 0   Facility-Administered Medications Prior to Visit  Medication Dose Route Frequency Provider Last Rate Last Admin   0.9 %  sodium chloride  infusion  500 mL Intravenous Continuous Armbruster, Elspeth SQUIBB, MD        Review of Systems  Constitutional:  Negative for chills, diaphoresis, fever, malaise/fatigue and weight loss.   HENT:  Negative for congestion.   Respiratory:  Positive for shortness of breath. Negative for cough, hemoptysis, sputum production and wheezing.   Cardiovascular:  Negative for chest pain, palpitations and leg swelling.     Objective:   Vitals:   04/05/24 1139 04/05/24 1140 04/05/24 1141 04/05/24 1142  BP:      Pulse:      Height:      Weight:      SpO2: 93% Comment: at rest 95% Comment: Lap 1 with activity 96% Comment: Lap 2 with activity 94% Comment: Lap 3 with activity  BMI (Calculated):          Physical Exam: General: Well-appearing, no acute distress HENT: [place], AT Eyes: EOMI, no scleral icterus Respiratory:  Clear to auscultation bilaterally.  No crackles, wheezing or rales Cardiovascular: RRR, -M/R/G, no JVD Extremities:-Edema,-tenderness Neuro: AAO x4, CNII-XII grossly intact Psych: Normal mood, normal affect    Data Reviewed:  Imaging: CT Cardiac scoring 01/31/20 - Lung fields with no infiltrate, effusion or edema. Lungs with no pulmonary nodules or masses CTA Chest Aorta 03/18/23 - Scarring with mild volume loss in lingula with nodular configuration in 9mm, centrilobular emphysema  PFT: 05/24/20 FVC 3.65 (72%) FEV1 1.86 (49%) Ratio 45  TLC 106% RV 142% RV/TLC 130% DLCO 69% Interpretation: Severe obstructive defect with air trapping and reduced DLCO consistent with emphysema. Significant bronchodilator response present.  04/05/24 FVC 2.96 (60%) FEV1 1.34 (36%) Ratio 45  TLC 109% DLCO 51% Interpretation: Very severe obstructive defect with air trapping and moderately reduced DLCO. Significant bronchodilator response in FVC and FEV1.   Labs: CBC No results found for: WBC, RBC, HGB, HCT, PLT, MCV, MCH, MCHC, RDW, LYMPHSABS, MONOABS, EOSABS, BASOSABS  Assessment & Plan:   Discussion: 68 year old male active smoker who presents for COPD follow-up. Mildly symptomatic with DOE however worsened. Will increase ICS strength on triple  therapy regimen. Reviewed PFTs with worsening obstruction. Discussed clinical course and management of COPD including bronchodilator regimen, preventive care including vaccinations and action plan for exacerbation.  Very severe COPD Deconditioning --Reviewed pulmonary function test --Will contact pharmacy team regarding inhaler options. Will change to different inhaler with stronger inhaled steroid component --STOP Breztri . Will call you today regarding what changes  ADDENDUM: START Advair  230-21 mcg TWO puffs TWICE a day                                    START Spiriva  2.5 mcg TWO puffs ONCE a day --CONTINUE Albuterol  AS NEEDED for shortness of breath or wheezing. OK to use prior to exercise --Ambulatory O2 today --Encouraged regular aerobic exercise five days a week for 30 min  Tobacco abuse Patient is an active smoker. We discussed smoking cessation for 5 minutes. We discussed triggers and stressors and ways to deal with them. We discussed barriers to continued smoking and benefits of smoking cessation. Provided patient with information cessation techniques and interventions including  quitline.   Health Maintenance Immunization History  Administered Date(s) Administered   Influenza,inj,Quad PF,6+ Mos 12/28/2019   PFIZER(Purple Top)SARS-COV-2 Vaccination 07/15/2019, 08/04/2019, 04/23/2020   Pfizer Covid-19 Vaccine Bivalent Booster 36yrs & up 02/12/2021   Pfizer(Comirnaty )Fall Seasonal Vaccine 12 years and older 04/17/2022   CT Lung Screen - We discussed risks and benefits of screen. Patient declined for now and will like to revisit at next   No orders of the defined types were placed in this encounter.  Meds ordered this encounter  Medications   fluticasone -salmeterol (ADVAIR  HFA) 230-21 MCG/ACT inhaler    Sig: Inhale 2 puffs into the lungs 2 (two) times daily.    Dispense:  8 g    Refill:  5    Ok generic if insurance preferred   Tiotropium Bromide  (SPIRIVA  RESPIMAT) 2.5  MCG/ACT AERS    Sig: Inhale 2 puffs into the lungs daily at 2 PM.    Dispense:  4 g    Refill:  5   albuterol  (VENTOLIN  HFA) 108 (90 Base) MCG/ACT inhaler    Sig: Inhale 2 puffs into the lungs every 6 (six) hours as needed for wheezing or shortness of breath.    Dispense:  6.7 g    Refill:  5  No follow-ups on file.  I have spent a total time of 30-minutes on the day of the appointment including chart review, data review, collecting history, coordinating care and discussing medical diagnosis and plan with the patient/family. Past medical history, allergies, medications were reviewed. Pertinent imaging, labs and tests included in this note have been reviewed and interpreted independently by me.  Alexanderjames Berg Slater Staff, MD [facility] 04/05/2024 11:19 AM

## 2024-04-11 ENCOUNTER — Other Ambulatory Visit: Payer: Self-pay | Admitting: Cardiovascular Disease

## 2024-04-11 ENCOUNTER — Encounter (HOSPITAL_BASED_OUTPATIENT_CLINIC_OR_DEPARTMENT_OTHER): Payer: Self-pay | Admitting: Pulmonary Disease

## 2024-04-11 ENCOUNTER — Other Ambulatory Visit: Payer: Self-pay | Admitting: Nurse Practitioner

## 2024-05-02 ENCOUNTER — Other Ambulatory Visit (HOSPITAL_BASED_OUTPATIENT_CLINIC_OR_DEPARTMENT_OTHER): Payer: Self-pay

## 2024-05-04 ENCOUNTER — Telehealth (HOSPITAL_BASED_OUTPATIENT_CLINIC_OR_DEPARTMENT_OTHER): Payer: Self-pay

## 2024-05-04 ENCOUNTER — Other Ambulatory Visit (HOSPITAL_BASED_OUTPATIENT_CLINIC_OR_DEPARTMENT_OTHER): Payer: Self-pay

## 2024-05-04 NOTE — Telephone Encounter (Signed)
 Please contact patient and let him know pharmacy team has investigated his inhaler costs and unfortunately due to his deductible, will not have cheaper options until deductible is met.

## 2024-05-04 NOTE — Telephone Encounter (Signed)
 Copied from CRM [id number]. Topic: Clinical - Prescription Issue >> May 04, 2024  2:20 PM Corean SAUNDERS wrote: Reason for CRM: Patient states his co pays for the following medication have skyrocketed and he inquiring of if Dr. Kassie can prescribe him generics or alternatives.   Tiotropium Bromide  (SPIRIVA  RESPIMAT) 2.5 MCG/ACT AERS                           fluticasone -salmeterol (ADVAIR  HFA) 230-21 MCG/ACT inhaler   Please send to: [facility] [address] [place] [address] Phone: [phone] Fax: [phone]

## 2024-05-05 ENCOUNTER — Other Ambulatory Visit: Payer: Self-pay

## 2024-05-05 ENCOUNTER — Other Ambulatory Visit (HOSPITAL_BASED_OUTPATIENT_CLINIC_OR_DEPARTMENT_OTHER): Payer: Self-pay

## 2024-05-05 ENCOUNTER — Other Ambulatory Visit (HOSPITAL_COMMUNITY): Payer: Self-pay

## 2024-05-05 NOTE — Telephone Encounter (Signed)
 Pt.notified

## 2024-05-19 ENCOUNTER — Ambulatory Visit: Payer: Self-pay | Admitting: Cardiovascular Disease

## 2024-05-19 ENCOUNTER — Ambulatory Visit (HOSPITAL_COMMUNITY)
Admission: RE | Admit: 2024-05-19 | Discharge: 2024-05-19 | Disposition: A | Discharge status: Home / Self Care | Source: Ambulatory Visit | Attending: Cardiology | Admitting: Cardiology

## 2024-05-19 DIAGNOSIS — E785 Hyperlipidemia, unspecified: Secondary | ICD-10-CM | POA: Diagnosis present

## 2024-05-19 DIAGNOSIS — I2510 Atherosclerotic heart disease of native coronary artery without angina pectoris: Secondary | ICD-10-CM | POA: Diagnosis present

## 2024-05-19 DIAGNOSIS — R0600 Dyspnea, unspecified: Secondary | ICD-10-CM

## 2024-05-19 DIAGNOSIS — R0602 Shortness of breath: Secondary | ICD-10-CM | POA: Insufficient documentation

## 2024-05-19 DIAGNOSIS — Z720 Tobacco use: Secondary | ICD-10-CM | POA: Diagnosis present

## 2024-05-19 DIAGNOSIS — I10 Essential (primary) hypertension: Secondary | ICD-10-CM | POA: Insufficient documentation

## 2024-05-19 DIAGNOSIS — J449 Chronic obstructive pulmonary disease, unspecified: Secondary | ICD-10-CM | POA: Insufficient documentation

## 2024-05-19 LAB — ECHOCARDIOGRAM COMPLETE
Area-P 1/2: 3.2 cm2
S' Lateral: 2.6 cm

## 2024-06-01 ENCOUNTER — Other Ambulatory Visit (HOSPITAL_BASED_OUTPATIENT_CLINIC_OR_DEPARTMENT_OTHER): Payer: Self-pay

## 2024-06-01 ENCOUNTER — Other Ambulatory Visit: Payer: Self-pay

## 2024-07-21 ENCOUNTER — Ambulatory Visit (HOSPITAL_BASED_OUTPATIENT_CLINIC_OR_DEPARTMENT_OTHER): Admitting: Pulmonary Disease
